# Patient Record
Sex: Male | Born: 1955 | Race: White | Hispanic: No | Marital: Single | Smoking: Former smoker
Health system: Southern US, Community
[De-identification: ages and names within clinical notes are randomized; demographics above are authoritative.]

## PROBLEM LIST (undated history)

## (undated) DIAGNOSIS — M1990 Unspecified osteoarthritis, unspecified site: Secondary | ICD-10-CM

## (undated) DIAGNOSIS — I10 Essential (primary) hypertension: Secondary | ICD-10-CM

## (undated) DIAGNOSIS — Z8719 Personal history of other diseases of the digestive system: Secondary | ICD-10-CM

## (undated) HISTORY — PX: EPIDIDYMIS SURGERY: SHX843

## (undated) HISTORY — DX: Unspecified osteoarthritis, unspecified site: M19.90

## (undated) HISTORY — PX: INGUINAL HERNIA REPAIR: SUR1180

## (undated) HISTORY — PX: NERVE GRAFT: SHX721

## (undated) HISTORY — PX: POLYPECTOMY: SHX149

## (undated) HISTORY — PX: DENTAL SURGERY: SHX609

## (undated) HISTORY — PX: EYE SURGERY: SHX253

## (undated) HISTORY — DX: Essential (primary) hypertension: I10

## (undated) HISTORY — DX: Personal history of other diseases of the digestive system: Z87.19

## (undated) HISTORY — PX: OTHER SURGICAL HISTORY: SHX169

## (undated) HISTORY — PX: COLONOSCOPY: SHX174

## (undated) HISTORY — PX: FRACTURE SURGERY: SHX138

---

## 1996-07-01 HISTORY — PX: UPPER GASTROINTESTINAL ENDOSCOPY: SHX188

## 2015-07-02 HISTORY — PX: COLONOSCOPY: SHX174

## 2016-03-16 DIAGNOSIS — E559 Vitamin D deficiency, unspecified: Secondary | ICD-10-CM | POA: Insufficient documentation

## 2016-03-16 DIAGNOSIS — I10 Essential (primary) hypertension: Secondary | ICD-10-CM | POA: Insufficient documentation

## 2016-03-16 DIAGNOSIS — R7301 Impaired fasting glucose: Secondary | ICD-10-CM | POA: Insufficient documentation

## 2016-03-16 DIAGNOSIS — K219 Gastro-esophageal reflux disease without esophagitis: Secondary | ICD-10-CM | POA: Insufficient documentation

## 2017-11-11 DIAGNOSIS — R202 Paresthesia of skin: Secondary | ICD-10-CM | POA: Insufficient documentation

## 2018-03-19 DIAGNOSIS — Z8719 Personal history of other diseases of the digestive system: Secondary | ICD-10-CM | POA: Insufficient documentation

## 2019-05-05 ENCOUNTER — Other Ambulatory Visit: Payer: Self-pay

## 2019-05-06 ENCOUNTER — Encounter: Payer: Self-pay | Admitting: Family Medicine

## 2019-05-06 ENCOUNTER — Ambulatory Visit (INDEPENDENT_AMBULATORY_CARE_PROVIDER_SITE_OTHER): Payer: Medicare Other | Admitting: Family Medicine

## 2019-05-06 VITALS — BP 142/76 | HR 62 | Ht 70.0 in | Wt 214.5 lb

## 2019-05-06 DIAGNOSIS — Z0000 Encounter for general adult medical examination without abnormal findings: Secondary | ICD-10-CM | POA: Insufficient documentation

## 2019-05-06 DIAGNOSIS — Z114 Encounter for screening for human immunodeficiency virus [HIV]: Secondary | ICD-10-CM

## 2019-05-06 DIAGNOSIS — Z23 Encounter for immunization: Secondary | ICD-10-CM | POA: Diagnosis not present

## 2019-05-06 DIAGNOSIS — E559 Vitamin D deficiency, unspecified: Secondary | ICD-10-CM

## 2019-05-06 DIAGNOSIS — I10 Essential (primary) hypertension: Secondary | ICD-10-CM | POA: Diagnosis not present

## 2019-05-06 DIAGNOSIS — F1010 Alcohol abuse, uncomplicated: Secondary | ICD-10-CM | POA: Diagnosis not present

## 2019-05-06 DIAGNOSIS — Z125 Encounter for screening for malignant neoplasm of prostate: Secondary | ICD-10-CM | POA: Insufficient documentation

## 2019-05-06 DIAGNOSIS — Z131 Encounter for screening for diabetes mellitus: Secondary | ICD-10-CM | POA: Insufficient documentation

## 2019-05-06 DIAGNOSIS — Z1159 Encounter for screening for other viral diseases: Secondary | ICD-10-CM | POA: Insufficient documentation
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
## 2019-05-06 NOTE — Patient Instructions (Signed)
Alcohol Use Disorder Alcohol use disorder is when your drinking disrupts your daily life. When you have this condition, you drink too much alcohol and you cannot control your drinking. Alcohol use disorder can cause serious problems with your physical health. It can affect your brain, heart, liver, pancreas, immune system, stomach, and intestines. Alcohol use disorder can increase your risk for certain cancers and cause problems with your mental health, such as depression, anxiety, psychosis, delirium, and dementia. People with this disorder risk hurting themselves and others. What are the causes? This condition is caused by drinking too much alcohol over time. It is not caused by drinking too much alcohol only one or two times. Some people with this condition drink alcohol to cope with or escape from negative life events. Others drink to relieve pain or symptoms of mental illness. What increases the risk? You are more likely to develop this condition if:  You have a family history of alcohol use disorder.  Your culture encourages drinking to the point of intoxication, or makes alcohol easy to get.  You had a mood or conduct disorder in childhood.  You have been a victim of abuse.  You are an adolescent and: ? You have poor grades or difficulties in school. ? Your caregivers do not talk to you about saying no to alcohol, or supervise your activities. ? You are impulsive or you have trouble with self-control. What are the signs or symptoms? Symptoms of this condition include:  Drinkingmore than you want to.  Drinking for longer than you want to.  Trying several times to drink less or to control your drinking.  Spending a lot of time getting alcohol, drinking, or recovering from drinking.  Craving alcohol.  Having problems at work, at school, or at home due to drinking.  Having problems in relationships due to drinking.  Drinking when it is dangerous to drink, such as before  driving a car.  Continuing to drink even though you know you might have a physical or mental problem related to drinking.  Needing more and more alcohol to get the same effect you want from the alcohol (building up tolerance).  Having symptoms of withdrawal when you stop drinking. Symptoms of withdrawal include: ? Fatigue. ? Nightmares. ? Trouble sleeping. ? Depression. ? Anxiety. ? Fever. ? Seizures. ? Severe confusion. ? Feeling or seeing things that are not there (hallucinations). ? Tremors. ? Rapid heart rate. ? Rapid breathing. ? High blood pressure.  Drinking to avoid symptoms of withdrawal. How is this diagnosed? This condition is diagnosed with an assessment. Your health care provider may start the assessment by asking three or four questions about your drinking. Your health care provider may perform a physical exam or do lab tests to see if you have physical problems resulting from alcohol use. She or he may refer you to a mental health professional for evaluation. How is this treated? Some people with alcohol use disorder are able to reduce their alcohol use to low-risk levels. Others need to completely quit drinking alcohol. When necessary, mental health professionals with specialized training in substance use treatment can help. Your health care provider can help you decide how severe your alcohol use disorder is and what type of treatment you need. The following forms of treatment are available:  Detoxification. Detoxification involves quitting drinking and using prescription medicines within the first week to help lessen withdrawal symptoms. This treatment is important for people who have had withdrawal symptoms before and for heavy drinkers  who are likely to have withdrawal symptoms. Alcohol withdrawal can be dangerous, and in severe cases, it can cause death. Detoxification may be provided in a home, community, or primary care setting, or in a hospital or substance use  treatment facility.  Counseling. This treatment is also called talk therapy. It is provided by substance use treatment counselors. A counselor can address the reasons you use alcohol and suggest ways to keep you from drinking again or to prevent problem drinking. The goals of talk therapy are to: ? Find healthy activities and ways for you to cope with stress. ? Identify and avoid the things that trigger your alcohol use. ? Help you learn how to handle cravings.  Medicines.Medicines can help treat alcohol use disorder by: ? Decreasing alcohol cravings. ? Decreasing the positive feeling you have when you drink alcohol. ? Causing an uncomfortable physical reaction when you drink alcohol (aversion therapy).  Support groups. Support groups are led by people who have quit drinking. They provide emotional support, advice, and guidance. These forms of treatment are often combined. Some people with this condition benefit from a combination of treatments provided by specialized substance use treatment centers. Follow these instructions at home:  Take over-the-counter and prescription medicines only as told by your health care provider.  Check with your health care provider before starting any new medicines.  Ask friends and family members not to offer you alcohol.  Avoid situations where alcohol is served, including gatherings where others are drinking alcohol.  Create a plan for what to do when you are tempted to use alcohol.  Find hobbies or activities that you enjoy that do not include alcohol.  Keep all follow-up visits as told by your health care provider. This is important. How is this prevented?  If you drink, limit alcohol intake to no more than 1 drink a day for nonpregnant women and 2 drinks a day for men. One drink equals 12 oz of beer, 5 oz of wine, or 1 oz of hard liquor.  If you have a mental health condition, get treatment and support.  Do not give alcohol to adolescents.   If you are an adolescent: ? Do not drink alcohol. ? Do not be afraid to say no if someone offers you alcohol. Speak up about why you do not want to drink. You can be a positive role model for your friends and set a good example for those around you by not drinking alcohol. ? If your friends drink, spend time with others who do not drink alcohol. Make new friends who do not use alcohol. ? Find healthy ways to manage stress and emotions, such as meditation or deep breathing, exercise, spending time in nature, listening to music, or talking with a trusted friend or family member. Contact a health care provider if:  You are not able to take your medicines as told.  Your symptoms get worse.  You return to drinking alcohol (relapse) and your symptoms get worse. Get help right away if:  You have thoughts about hurting yourself or others. If you ever feel like you may hurt yourself or others, or have thoughts about taking your own life, get help right away. You can go to your nearest emergency department or call:  Your local emergency services (911 in the [place]  A suicide crisis helpline, such as the [facility] at (903)242-9409. This is open 24 hours a day. Summary  Alcohol use disorder is when your drinking disrupts your daily  life. When you have this condition, you drink too much alcohol and you cannot control your drinking.  Treatment may include detoxification, counseling, medicine, and support groups.  Ask friends and family members not to offer you alcohol. Avoid situations where alcohol is served.  Get help right away if you have thoughts about hurting yourself or others. This information is not intended to replace advice given to you by your health care provider. Make sure you discuss any questions you have with your health care provider. Document Released: 07/25/2004 Document Revised: 05/30/2017 Document Reviewed: 03/14/2016 Elsevier Patient Education   [place] Maintenance After Age 8 After age 106, you are at a higher risk for certain long-term diseases and infections as well as injuries from falls. Falls are a major cause of broken bones and head injuries in people who are older than age 59. Getting regular preventive care can help to keep you healthy and well. Preventive care includes getting regular testing and making lifestyle changes as recommended by your health care provider. Talk with your health care provider about:  Which screenings and tests you should have. A screening is a test that checks for a disease when you have no symptoms.  A diet and exercise plan that is right for you. What should I know about screenings and tests to prevent falls? Screening and testing are the best ways to find a health problem early. Early diagnosis and treatment give you the best chance of managing medical conditions that are common after age 22. Certain conditions and lifestyle choices may make you more likely to have a fall. Your health care provider may recommend:  Regular vision checks. Poor vision and conditions such as cataracts can make you more likely to have a fall. If you wear glasses, make sure to get your prescription updated if your vision changes.  Medicine review. Work with your health care provider to regularly review all of the medicines you are taking, including over-the-counter medicines. Ask your health care provider about any side effects that may make you more likely to have a fall. Tell your health care provider if any medicines that you take make you feel dizzy or sleepy.  Osteoporosis screening. Osteoporosis is a condition that causes the bones to get weaker. This can make the bones weak and cause them to break more easily.  Blood pressure screening. Blood pressure changes and medicines to control blood pressure can make you feel dizzy.  Strength and balance checks. Your health care provider may recommend  certain tests to check your strength and balance while standing, walking, or changing positions.  Foot health exam. Foot pain and numbness, as well as not wearing proper footwear, can make you more likely to have a fall.  Depression screening. You may be more likely to have a fall if you have a fear of falling, feel emotionally low, or feel unable to do activities that you used to do.  Alcohol use screening. Using too much alcohol can affect your balance and may make you more likely to have a fall. What actions can I take to lower my risk of falls? General instructions  Talk with your health care provider about your risks for falling. Tell your health care provider if: ? You fall. Be sure to tell your health care provider about all falls, even ones that seem minor. ? You feel dizzy, sleepy, or off-balance.  Take over-the-counter and prescription medicines only as told by your health care provider. These include any  supplements.  Eat a healthy diet and maintain a healthy weight. A healthy diet includes low-fat dairy products, low-fat (lean) meats, and fiber from whole grains, beans, and lots of fruits and vegetables. Home safety  Remove any tripping hazards, such as rugs, cords, and clutter.  Install safety equipment such as grab bars in bathrooms and safety rails on stairs.  Keep rooms and walkways well-lit. Activity   Follow a regular exercise program to stay fit. This will help you maintain your balance. Ask your health care provider what types of exercise are appropriate for you.  If you need a cane or walker, use it as recommended by your health care provider.  Wear supportive shoes that have nonskid soles. Lifestyle  Do not drink alcohol if your health care provider tells you not to drink.  If you drink alcohol, limit how much you have: ? 0-1 drink a day for women. ? 0-2 drinks a day for men.  Be aware of how much alcohol is in your drink. In the [place] one drink equals  one typical bottle of beer (12 oz), one-half glass of wine (5 oz), or one shot of hard liquor (1 oz).  Do not use any products that contain nicotine or tobacco, such as cigarettes and e-cigarettes. If you need help quitting, ask your health care provider. Summary  Having a healthy lifestyle and getting preventive care can help to protect your health and wellness after age 57.  Screening and testing are the best way to find a health problem early and help you avoid having a fall. Early diagnosis and treatment give you the best chance for managing medical conditions that are more common for people who are older than age 12.  Falls are a major cause of broken bones and head injuries in people who are older than age 45. Take precautions to prevent a fall at home.  Work with your health care provider to learn what changes you can make to improve your health and wellness and to prevent falls. This information is not intended to replace advice given to you by your health care provider. Make sure you discuss any questions you have with your health care provider. Document Released: 04/30/2017 Document Revised: 10/08/2018 Document Reviewed: 04/30/2017 Elsevier Patient Education  2020 [place] 65 Years and Older, Male Preventive care refers to lifestyle choices and visits with your health care provider that can promote health and wellness. This includes:  A yearly physical exam. This is also called an annual well check.  Regular dental and eye exams.  Immunizations.  Screening for certain conditions.  Healthy lifestyle choices, such as diet and exercise. What can I expect for my preventive care visit? Physical exam Your health care provider will check:  Height and weight. These may be used to calculate body mass index (BMI), which is a measurement that tells if you are at a healthy weight.  Heart rate and blood pressure.  Your skin for abnormal spots. Counseling  Your health care provider may ask you questions about:  Alcohol, tobacco, and drug use.  Emotional well-being.  Home and relationship well-being.  Sexual activity.  Eating habits.  History of falls.  Memory and ability to understand (cognition).  Work and work Statistician. What immunizations do I need?  Influenza (flu) vaccine  This is recommended every year. Tetanus, diphtheria, and pertussis (Tdap) vaccine  You may need a Td booster every 10 years. Varicella (chickenpox) vaccine  You may need this vaccine if  you have not already been vaccinated. Zoster (shingles) vaccine  You may need this after age 61. Pneumococcal conjugate (PCV13) vaccine  One dose is recommended after age 4. Pneumococcal polysaccharide (PPSV23) vaccine  One dose is recommended after age 69. Measles, mumps, and rubella (MMR) vaccine  You may need at least one dose of MMR if you were born in 1957 or later. You may also need a second dose. Meningococcal conjugate (MenACWY) vaccine  You may need this if you have certain conditions. Hepatitis A vaccine  You may need this if you have certain conditions or if you travel or work in places where you may be exposed to hepatitis A. Hepatitis B vaccine  You may need this if you have certain conditions or if you travel or work in places where you may be exposed to hepatitis B. Haemophilus influenzae type b (Hib) vaccine  You may need this if you have certain conditions. You may receive vaccines as individual doses or as more than one vaccine together in one shot (combination vaccines). Talk with your health care provider about the risks and benefits of combination vaccines. What tests do I need? Blood tests  Lipid and cholesterol levels. These may be checked every 5 years, or more frequently depending on your overall health.  Hepatitis C test.  Hepatitis B test. Screening  Lung cancer screening. You may have this screening every year starting  at age 15 if you have a 30-pack-year history of smoking and currently smoke or have quit within the past 15 years.  Colorectal cancer screening. All adults should have this screening starting at age 19 and continuing until age 58. Your health care provider may recommend screening at age 60 if you are at increased risk. You will have tests every 1-10 years, depending on your results and the type of screening test.  Prostate cancer screening. Recommendations will vary depending on your family history and other risks.  Diabetes screening. This is done by checking your blood sugar (glucose) after you have not eaten for a while (fasting). You may have this done every 1-3 years.  Abdominal aortic aneurysm (AAA) screening. You may need this if you are a current or former smoker.  Sexually transmitted disease (STD) testing. Follow these instructions at home: Eating and drinking  Eat a diet that includes fresh fruits and vegetables, whole grains, lean protein, and low-fat dairy products. Limit your intake of foods with high amounts of sugar, saturated fats, and salt.  Take vitamin and mineral supplements as recommended by your health care provider.  Do not drink alcohol if your health care provider tells you not to drink.  If you drink alcohol: ? Limit how much you have to 0-2 drinks a day. ? Be aware of how much alcohol is in your drink. In the [place] one drink equals one 12 oz bottle of beer (355 mL), one 5 oz glass of wine (148 mL), or one 1 oz glass of hard liquor (44 mL). Lifestyle  Take daily care of your teeth and gums.  Stay active. Exercise for at least 30 minutes on 5 or more days each week.  Do not use any products that contain nicotine or tobacco, such as cigarettes, e-cigarettes, and chewing tobacco. If you need help quitting, ask your health care provider.  If you are sexually active, practice safe sex. Use a condom or other form of protection to prevent STIs (sexually transmitted  infections).  Talk with your health care provider about taking a low-dose  aspirin or statin. What's next?  Visit your health care provider once a year for a well check visit.  Ask your health care provider how often you should have your eyes and teeth checked.  Stay up to date on all vaccines. This information is not intended to replace advice given to you by your health care provider. Make sure you discuss any questions you have with your health care provider. Document Released: 07/14/2015 Document Revised: 06/11/2018 Document Reviewed: 06/11/2018 Elsevier Patient Education  2020 [employer].

## 2019-05-10 ENCOUNTER — Other Ambulatory Visit: Payer: Self-pay

## 2019-05-11 ENCOUNTER — Other Ambulatory Visit (INDEPENDENT_AMBULATORY_CARE_PROVIDER_SITE_OTHER): Payer: Medicare Other

## 2019-05-11 DIAGNOSIS — Z0000 Encounter for general adult medical examination without abnormal findings: Secondary | ICD-10-CM

## 2019-05-11 DIAGNOSIS — F1010 Alcohol abuse, uncomplicated: Secondary | ICD-10-CM

## 2019-05-11 DIAGNOSIS — I10 Essential (primary) hypertension: Secondary | ICD-10-CM | POA: Diagnosis not present

## 2019-05-11 DIAGNOSIS — Z114 Encounter for screening for human immunodeficiency virus [HIV]: Secondary | ICD-10-CM

## 2019-05-11 DIAGNOSIS — E559 Vitamin D deficiency, unspecified: Secondary | ICD-10-CM

## 2019-05-11 DIAGNOSIS — Z1159 Encounter for screening for other viral diseases: Secondary | ICD-10-CM

## 2019-05-11 LAB — CBC
HCT: 44.3 % (ref 39.0–52.0)
Hemoglobin: 14.7 g/dL (ref 13.0–17.0)
MCHC: 33.3 g/dL (ref 30.0–36.0)
MCV: 99.8 fl (ref 78.0–100.0)
Platelets: 198 10*3/uL (ref 150.0–400.0)
RBC: 4.44 Mil/uL (ref 4.22–5.81)
RDW: 13.5 % (ref 11.5–15.5)
WBC: 6.1 10*3/uL (ref 4.0–10.5)

## 2019-05-11 LAB — URINALYSIS, ROUTINE W REFLEX MICROSCOPIC
Bilirubin Urine: NEGATIVE
Ketones, ur: NEGATIVE
Leukocytes,Ua: NEGATIVE
Nitrite: NEGATIVE
RBC / HPF: NONE SEEN (ref 0–?)
Specific Gravity, Urine: 1.01 (ref 1.000–1.030)
Total Protein, Urine: NEGATIVE
Urine Glucose: NEGATIVE
Urobilinogen, UA: 0.2 (ref 0.0–1.0)
WBC, UA: NONE SEEN (ref 0–?)
pH: 6 (ref 5.0–8.0)

## 2019-05-11 LAB — COMPREHENSIVE METABOLIC PANEL
ALT: 21 U/L (ref 0–53)
AST: 18 U/L (ref 0–37)
Albumin: 4.4 g/dL (ref 3.5–5.2)
Alkaline Phosphatase: 92 U/L (ref 39–117)
BUN: 12 mg/dL (ref 6–23)
CO2: 27 mEq/L (ref 19–32)
Calcium: 9.3 mg/dL (ref 8.4–10.5)
Chloride: 105 mEq/L (ref 96–112)
Creatinine, Ser: 0.75 mg/dL (ref 0.40–1.50)
GFR: 104.41 mL/min (ref >60.00)
Glucose, Bld: 109 mg/dL — ABNORMAL HIGH (ref 70–99)
Potassium: 4 mEq/L (ref 3.5–5.1)
Sodium: 139 mEq/L (ref 135–145)
Total Bilirubin: 0.4 mg/dL (ref 0.2–1.2)
Total Protein: 6.4 g/dL (ref 6.0–8.3)

## 2019-05-11 LAB — VITAMIN D 25 HYDROXY (VIT D DEFICIENCY, FRACTURES): VITD: 31.13 ng/mL (ref 30.00–100.00)

## 2019-05-11 LAB — LIPID PANEL
Cholesterol: 190 mg/dL (ref 0–200)
HDL: 99.3 mg/dL (ref >39.00)
LDL Cholesterol: 82 mg/dL (ref 0–99)
NonHDL: 90.49
Total CHOL/HDL Ratio: 2
Triglycerides: 41 mg/dL (ref 0.0–149.0)
VLDL: 8.2 mg/dL (ref 0.0–40.0)

## 2019-05-11 LAB — GAMMA GT: GGT: 35 U/L (ref 7–51)

## 2019-05-11 LAB — PSA: PSA: 1.47 ng/mL (ref 0.10–4.00)

## 2019-05-12 LAB — HIV ANTIBODY (ROUTINE TESTING W REFLEX): HIV 1&2 Ab, 4th Generation: NONREACTIVE

## 2019-05-12 LAB — HEPATITIS C ANTIBODY
Hepatitis C Ab: NONREACTIVE
SIGNAL TO CUT-OFF: 0.01 (ref <1.00)

## 2019-05-18 ENCOUNTER — Telehealth: Payer: Self-pay | Admitting: Family Medicine

## 2019-05-18 NOTE — Telephone Encounter (Signed)
losartan (COZAAR) 100 MG tablet    Patient requesting a refill of this medication. However he is requesting it be 50mg  rather than the 100mg .     Pharmacy:  [id number] - [place], [place] [id number] (Phone) [phone] (Fax)

## 2019-05-18 NOTE — Telephone Encounter (Signed)
Pt takes 1/2 tablet, okay to change to losartan 50 mg 1 tab qd?

## 2019-05-19 MED ORDER — LOSARTAN POTASSIUM 50 MG PO TABS: 50.0000 mg | ORAL_TABLET | Freq: Every day | ORAL | 1 refills | Status: DC

## 2019-05-19 NOTE — Telephone Encounter (Signed)
Yes. Please have him check and record his Bps on the 50mg  dose.

## 2019-06-17 ENCOUNTER — Telehealth: Payer: Self-pay | Admitting: Family Medicine

## 2019-06-17 NOTE — Telephone Encounter (Signed)
ROI fax to [facility] for patient records.

## 2019-06-18 NOTE — Telephone Encounter (Signed)
rec'd from [facility] forwarded 6 pages to [place] Provider

## 2019-07-30 ENCOUNTER — Ambulatory Visit: Payer: PRIVATE HEALTH INSURANCE

## 2019-08-06 ENCOUNTER — Encounter: Payer: Self-pay | Admitting: Family Medicine

## 2019-08-07 ENCOUNTER — Ambulatory Visit: Payer: Medicare Other | Attending: Internal Medicine

## 2019-08-07 DIAGNOSIS — Z23 Encounter for immunization: Secondary | ICD-10-CM | POA: Insufficient documentation

## 2019-08-07 NOTE — Progress Notes (Signed)
   Covid-19 Vaccination Clinic  Name:  VENICE RUYLE    MRN: [id number] DOB: 1956-02-01  08/07/2019  Mr. Strum was observed post Covid-19 immunization for 15 minutes without incidence. He was provided with Vaccine Information Sheet and instruction to access the V-Safe system.   Mr. Hutzell was instructed to call 911 with any severe reactions post vaccine: Marland Kitchen Difficulty breathing  . Swelling of your face and throat  . A fast heartbeat  . A bad rash all over your body  . Dizziness and weakness    Immunizations Administered    Name Date Dose VIS Date Route   Pfizer COVID-19 Vaccine 08/07/2019  3:21 PM 0.3 mL 06/11/2019 Intramuscular   Manufacturer: [place]   Lot: [id number]   [place]: [id number]

## 2019-08-09 ENCOUNTER — Other Ambulatory Visit: Payer: Self-pay

## 2019-08-10 ENCOUNTER — Encounter: Payer: Self-pay | Admitting: Family Medicine

## 2019-08-10 ENCOUNTER — Ambulatory Visit (INDEPENDENT_AMBULATORY_CARE_PROVIDER_SITE_OTHER): Payer: Medicare Other | Admitting: Family Medicine

## 2019-08-10 VITALS — BP 146/94 | HR 67 | Temp 96.5°F | Ht 70.0 in | Wt 216.2 lb

## 2019-08-10 DIAGNOSIS — Z789 Other specified health status: Secondary | ICD-10-CM | POA: Insufficient documentation

## 2019-08-10 DIAGNOSIS — Z7289 Other problems related to lifestyle: Secondary | ICD-10-CM | POA: Insufficient documentation

## 2019-08-10 DIAGNOSIS — Z87448 Personal history of other diseases of urinary system: Secondary | ICD-10-CM | POA: Insufficient documentation

## 2019-08-10 DIAGNOSIS — I10 Essential (primary) hypertension: Secondary | ICD-10-CM | POA: Diagnosis not present

## 2019-08-10 DIAGNOSIS — E559 Vitamin D deficiency, unspecified: Secondary | ICD-10-CM | POA: Diagnosis not present

## 2019-08-10 DIAGNOSIS — Z87898 Personal history of other specified conditions: Secondary | ICD-10-CM | POA: Insufficient documentation

## 2019-08-10 MED ORDER — LOSARTAN POTASSIUM 100 MG PO TABS: 100.0000 mg | ORAL_TABLET | Freq: Every day | ORAL | 0 refills | Status: DC
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
## 2019-08-20 ENCOUNTER — Ambulatory Visit: Payer: PRIVATE HEALTH INSURANCE

## 2019-09-01 ENCOUNTER — Ambulatory Visit: Payer: Medicare Other | Attending: Internal Medicine

## 2019-09-01 DIAGNOSIS — Z23 Encounter for immunization: Secondary | ICD-10-CM | POA: Insufficient documentation

## 2019-09-01 NOTE — Progress Notes (Signed)
   Covid-19 Vaccination Clinic  Name:  Kyle Crawford    MRN: [id number] DOB: 1956/04/24  09/01/2019  Mr. Kyle Crawford was observed post Covid-19 immunization for 15 minutes without incident. He was provided with Vaccine Information Sheet and instruction to access the V-Safe system.   Mr. Kyle Crawford was instructed to call 911 with any severe reactions post vaccine: Marland Kitchen Difficulty breathing  . Swelling of face and throat  . A fast heartbeat  . A bad rash all over body  . Dizziness and weakness   Immunizations Administered    Name Date Dose VIS Date Route   Pfizer COVID-19 Vaccine 09/01/2019 11:17 AM 0.3 mL 06/11/2019 Intramuscular   Manufacturer: [place]   Lot: [id number]   [place]: [id number]

## 2019-10-12 ENCOUNTER — Other Ambulatory Visit: Payer: Self-pay

## 2019-10-13 ENCOUNTER — Ambulatory Visit (INDEPENDENT_AMBULATORY_CARE_PROVIDER_SITE_OTHER): Payer: Medicare Other

## 2019-10-13 ENCOUNTER — Ambulatory Visit (INDEPENDENT_AMBULATORY_CARE_PROVIDER_SITE_OTHER): Payer: Medicare Other | Admitting: Nurse Practitioner

## 2019-10-13 ENCOUNTER — Encounter: Payer: Self-pay | Admitting: Nurse Practitioner

## 2019-10-13 VITALS — BP 140/84 | HR 63 | Temp 97.2°F | Ht 70.0 in | Wt 214.6 lb

## 2019-10-13 DIAGNOSIS — S91331A Puncture wound without foreign body, right foot, initial encounter: Secondary | ICD-10-CM

## 2019-10-13 MED ORDER — CIPROFLOXACIN HCL 500 MG PO TABS: 500.0000 mg | ORAL_TABLET | Freq: Two times a day (BID) | ORAL | 0 refills | Status: DC

## 2019-10-13 MED ORDER — SULFAMETHOXAZOLE-TRIMETHOPRIM 800-160 MG PO TABS: 1.0000 | ORAL_TABLET | Freq: Two times a day (BID) | ORAL | 0 refills | Status: DC

## 2019-10-13 NOTE — Progress Notes (Signed)
Subjective:  Patient ID: Kyle Crawford, male    DOB: [birth year]  Age: 64 y.o. MRN: [id number]  CC: Foot Injury (Right foot has glass in the middle of foot 2 weeks ago//its go more sore but can't see it//heel went numb)  Foot Injury  The incident occurred more than 1 week ago. The incident occurred at home. Injury mechanism: stepped on pice of glass. The pain is present in the right heel. The quality of the pain is described as aching (numbness, redness). The pain has been constant since onset. Associated symptoms include a loss of sensation and numbness. Pertinent negatives include no inability to bear weight, loss of motion, muscle weakness or tingling. Possible foreign bodies include glass. The symptoms are aggravated by palpation and weight bearing. Treatments tried: soaking in warm water and epsom salt.  UTD with Tdap vaccine.  Reviewed past Medical, Social and Family history today.  Outpatient Medications Prior to Visit  Medication Sig Dispense Refill  . losartan (COZAAR) 100 MG tablet Take 1 tablet (100 mg total) by mouth daily. 90 tablet 0   No facility-administered medications prior to visit.   ROS See HPI  Objective:  BP 140/84   Pulse 63   Temp (!) 97.2 F (36.2 C) (Tympanic)   Ht 5\' 10"  (1.778 m)   Wt 214 lb 9.6 oz (97.3 kg)   SpO2 95%   BMI 30.79 kg/m   BP Readings from Last 3 Encounters:  10/13/19 140/84  08/10/19 (!) 146/94  05/06/19 (!) 142/76    Wt Readings from Last 3 Encounters:  10/13/19 214 lb 9.6 oz (97.3 kg)  08/10/19 216 lb 3.2 oz (98.1 kg)  05/06/19 214 lb 8 oz (97.3 kg)    Physical Exam Vitals reviewed.  Cardiovascular:     Rate and Rhythm: Normal rate.     Pulses: Normal pulses.          Dorsalis pedis pulses are 2+ on the right side.       Posterior tibial pulses are 2+ on the right side.  Pulmonary:     Effort: Pulmonary effort is normal.  Musculoskeletal:        General: Swelling, tenderness and signs of injury present.   Feet:  Feet:     Right foot:     Skin integrity: Erythema present.     Comments: Verbal consent obtained. Removal of foreign object from Right heel: With use of #11 blade and tweezers, I removed a small piece of glass, explored wound for additional piece and known found, minimal blood glass and no complication from the procedure. Cleaned with saline and covered dry guaze. Provide wound care instruction, sent for foot x-ray and oral abx prescribed (coverage for staph and pseudomonas) Skin:    Findings: Erythema present.  Neurological:     Mental Status: He is alert.    Lab Results  Component Value Date   WBC 6.1 05/11/2019   HGB 14.7 05/11/2019   HCT 44.3 05/11/2019   PLT 198.0 05/11/2019   GLUCOSE 109 (H) 05/11/2019   CHOL 190 05/11/2019   TRIG 41.0 05/11/2019   HDL 99.30 05/11/2019   LDLCALC 82 05/11/2019   ALT 21 05/11/2019   AST 18 05/11/2019   NA 139 05/11/2019   K 4.0 05/11/2019   CL 105 05/11/2019   CREATININE 0.75 05/11/2019   BUN 12 05/11/2019   CO2 27 05/11/2019   PSA 1.47 05/11/2019   Assessment & Plan:  This visit occurred during the SARS-CoV-2 public  health emergency.  Safety protocols were in place, including screening questions prior to the visit, additional usage of staff PPE, and extensive cleaning of exam room while observing appropriate contact time as indicated for disinfecting solutions.   Sushil was seen today for foot injury.  Diagnoses and all orders for this visit:  Puncture wound of right foot excluding toes with complication, initial encounter -     Discontinue: sulfamethoxazole-trimethoprim (BACTRIM DS) 800-160 MG tablet; Take 1 tablet by mouth 2 (two) times daily. -     DG Foot Complete Right -     ciprofloxacin (CIPRO) 500 MG tablet; Take 1 tablet (500 mg total) by mouth 2 (two) times daily. For plantar puncture wound  DG foot: negative for foreign object. I have discontinued Xayne Berardino. Sar "Greg"'s sulfamethoxazole-trimethoprim. I am  also having him start on ciprofloxacin. Additionally, I am having him maintain his losartan.  Meds ordered this encounter  Medications  . DISCONTD: sulfamethoxazole-trimethoprim (BACTRIM DS) 800-160 MG tablet    Sig: Take 1 tablet by mouth 2 (two) times daily.    Dispense:  14 tablet    Refill:  0    Order Specific Question:   Supervising Provider    Answer:   Ronnald Nian [id number]  . ciprofloxacin (CIPRO) 500 MG tablet    Sig: Take 1 tablet (500 mg total) by mouth 2 (two) times daily. For plantar puncture wound    Dispense:  14 tablet    Refill:  0    Discontinue bactrim.    Order Specific Question:   Supervising Provider    Answer:   Ronnald Nian [id number]    Problem List Items Addressed This Visit    None    Visit Diagnoses    Puncture wound of right foot excluding toes with complication, initial encounter    -  Primary   Relevant Medications   ciprofloxacin (CIPRO) 500 MG tablet   Other Relevant Orders   DG Foot Complete Right (Completed)      Follow-up: Return if symptoms worsen or fail to improve.  Wilfred Lacy, NP

## 2019-10-13 NOTE — Patient Instructions (Addendum)
DG foot: negative for foreign object.  Puncture Wound A puncture wound is an injury that is caused by a sharp, thin object that goes through your skin. A puncture wound usually does not leave a large opening in your skin, so it may not bleed a lot. However, when you get a puncture wound, dirt or other materials (foreign bodies) can be forced into your wound and can break off inside. This increases the chance of infection, such as tetanus. There are many sharp, pointed objects that can cause puncture wounds, including teeth, nails, splinters of glass, fishhooks, and needles. Treatment may include the following steps:  Washing out the wound with a germ-free (sterile) salt-water solution.  Having surgery to open the wound and remove materials from it.  Closing the wound with stitches (sutures).  Covering the wound with antibiotic ointment and a bandage (dressing). Depending on what caused the injury, you may also need a tetanus shot or a rabies shot. Follow these instructions at home: Medicines  Take or apply over-the-counter and prescription medicines only as told by your doctor.  If you were prescribed an antibiotic medicine, take or apply it as told by your doctor. Do not stop using the antibiotic even if your condition starts to get better. Bathing  Keep the bandage dry as told by your doctor.  Do not take baths, swim, or use a hot tub until your doctor approves. Ask your doctor if you may take showers. You may only be allowed to take sponge baths. Wound care   There are many ways to close and cover a wound. For example, a wound can be closed with stitches, skin glue, or skin tape (adhesive strips). Follow instructions from your doctor about how to take care of your wound. Make sure you: ? Wash your hands with soap and water before and after you change your bandage. If you cannot use soap and water, use hand sanitizer. ? Change your bandage as told by your doctor. ? Leave stitches,  skin glue, or skin tape strips in place. They may need to stay in place for 2 weeks or longer. If tape strips get loose and curl up, you may trim the loose edges. Do not remove tape strips completely unless your doctor says it is okay.  Clean the wound as told by your doctor.  Do not scratch or pick at the wound.  Check your wound every day for signs of infection. Watch for: ? Redness, swelling, or pain. ? Fluid or blood. ? Warmth. ? Pus or a bad smell. General instructions  Raise (elevate) the injured area above the level of your heart while you are sitting or lying down.  If your puncture wound is in your foot, ask your doctor if you need to avoid putting weight on your foot and for how long. Use crutches as told by your doctor.  Keep all follow-up visits as told by your doctor. This is important. Contact a doctor if:  You got a tetanus shot and you have any of these problems at the injection site: ? Swelling. ? Very bad pain. ? Redness. ? Bleeding.  You have a fever.  Your stitches come out.  You notice a bad smell coming from your wound or your bandage.  You notice something coming out of the wound, such as wood or glass.  Medicine does not help your pain.  You have more redness, swelling, or pain at the site of your wound.  You have fluid, blood, or pus  coming from your wound.  You notice a change in the color of your skin near your wound.  You need to change the bandage often because fluid, blood, or pus is coming from the wound.  You start to have a new rash.  You start to lose feeling (have numbness) around the wound.  You have warmth around your wound. Get help right away if:  You have very bad swelling around the wound.  Your pain quickly gets worse and is very bad.  You start to get painful skin lumps.  You have a red streak going away from your wound.  The wound is on your hand or foot and you: ? Cannot move a finger or toe like  normal. ? Notice that your fingers or toes look pale or blue. Summary  A puncture wound is an injury that is caused by a sharp, thin object that goes through your skin.  Treatment may include washing out the wound, having surgery to open the wound to clean it, closing the wound, and covering the wound with a bandage.  Follow instructions from your doctor about how to take care of your wound.  Contact your doctor if you have more redness, swelling, or pain at the site of your wound.  Keep all follow-up visits as told by your doctor. This is important. This information is not intended to replace advice given to you by your health care provider. Make sure you discuss any questions you have with your health care provider. Document Revised: 01/22/2018 Document Reviewed: 01/22/2018 Elsevier Patient Education  [place].

## 2019-10-14 ENCOUNTER — Encounter: Payer: Self-pay | Admitting: Nurse Practitioner

## 2019-11-01 ENCOUNTER — Other Ambulatory Visit: Payer: Self-pay | Admitting: Family Medicine

## 2019-11-03 ENCOUNTER — Other Ambulatory Visit: Payer: Self-pay | Admitting: Family Medicine

## 2019-11-03 DIAGNOSIS — I10 Essential (primary) hypertension: Secondary | ICD-10-CM

## 2019-11-08 ENCOUNTER — Other Ambulatory Visit: Payer: Self-pay

## 2019-11-09 ENCOUNTER — Ambulatory Visit: Payer: PRIVATE HEALTH INSURANCE | Admitting: Family Medicine

## 2020-01-30 ENCOUNTER — Other Ambulatory Visit: Payer: Self-pay | Admitting: Family Medicine

## 2020-01-30 DIAGNOSIS — I10 Essential (primary) hypertension: Secondary | ICD-10-CM

## 2020-01-31 ENCOUNTER — Other Ambulatory Visit: Payer: Self-pay

## 2020-02-01 ENCOUNTER — Encounter: Payer: Self-pay | Admitting: Family Medicine

## 2020-02-01 ENCOUNTER — Ambulatory Visit (INDEPENDENT_AMBULATORY_CARE_PROVIDER_SITE_OTHER): Payer: Medicare Other | Admitting: Family Medicine

## 2020-02-01 VITALS — BP 140/78 | HR 58 | Temp 97.6°F | Ht 70.0 in | Wt 211.2 lb

## 2020-02-01 DIAGNOSIS — L570 Actinic keratosis: Secondary | ICD-10-CM | POA: Diagnosis not present

## 2020-02-01 DIAGNOSIS — I10 Essential (primary) hypertension: Secondary | ICD-10-CM

## 2020-02-01 DIAGNOSIS — N5201 Erectile dysfunction due to arterial insufficiency: Secondary | ICD-10-CM | POA: Diagnosis not present

## 2020-02-01 LAB — BASIC METABOLIC PANEL
BUN: 13 mg/dL (ref 6–23)
CO2: 27 mEq/L (ref 19–32)
Calcium: 9.8 mg/dL (ref 8.4–10.5)
Chloride: 103 mEq/L (ref 96–112)
Creatinine, Ser: 0.85 mg/dL (ref 0.40–1.50)
GFR: 90.16 mL/min (ref >60.00)
Glucose, Bld: 96 mg/dL (ref 70–99)
Potassium: 4.3 mEq/L (ref 3.5–5.1)
Sodium: 136 mEq/L (ref 135–145)

## 2020-02-01 LAB — CBC
HCT: 44.7 % (ref 39.0–52.0)
Hemoglobin: 15 g/dL (ref 13.0–17.0)
MCHC: 33.7 g/dL (ref 30.0–36.0)
MCV: 99.6 fl (ref 78.0–100.0)
Platelets: 189 10*3/uL (ref 150.0–400.0)
RBC: 4.49 Mil/uL (ref 4.22–5.81)
RDW: 12.9 % (ref 11.5–15.5)
WBC: 6.2 10*3/uL (ref 4.0–10.5)

## 2020-02-01 MED ORDER — LOSARTAN POTASSIUM 100 MG PO TABS: 100.0000 mg | ORAL_TABLET | Freq: Every day | ORAL | 0 refills | Status: DC

## 2020-02-01 MED ORDER — SILDENAFIL CITRATE 20 MG PO TABS: ORAL_TABLET | ORAL | 1 refills | Status: DC

## 2020-02-01 MED ORDER — AMLODIPINE BESYLATE 5 MG PO TABS: 5.0000 mg | ORAL_TABLET | Freq: Every day | ORAL | 0 refills | Status: DC

## 2020-02-01 NOTE — Progress Notes (Signed)
Established Patient Office Visit  Subjective:  Patient ID: Kyle Crawford, male    DOB: [birth year]  Age: 64 y.o. MRN: [id number]  CC:  Chief Complaint  Patient presents with  . Follow-up    follow up on medication, would like sore on back checked, Rx for Viagra     HPI Kyle Crawford presents for follow-up of his blood pressure, ED in nonhealing lesions on his skin.  Has been taking losartan daily.  Blood pressure has been up in the 140s over 80s.  No issues taking the medication.  Tells me that he has decreased his alcohol intake significantly.  Has had issues with ED.  He is with a new partner.  There is difficulty achieving an erection.  Libido is okay.  Has had a place on his back that occasionally drains pus.  There is a place on the left side of his nose that is a small nonhealing sore.  Past Medical History:  Diagnosis Date  . Arthritis   . History of Barrett's esophagus   . Hypertension     Past Surgical History:  Procedure Laterality Date  . arm fracture surgery    . COLONOSCOPY    . DENTAL SURGERY    . EPIDIDYMIS SURGERY    . INGUINAL HERNIA REPAIR    . NERVE GRAFT    . tendon repair foot/ankle      Family History  Problem Relation Age of Onset  . Alzheimer's disease Father   . Bladder Cancer Father   . Diabetes Sister   . Diabetes Maternal Grandmother   . Heart attack Maternal Grandfather     Social History   Socioeconomic History  . Marital status: Single    Spouse name: Not on file  . Number of children: Not on file  . Years of education: Not on file  . Highest education level: Not on file  Occupational History  . Not on file  Tobacco Use  . Smoking status: Former Research scientist (life sciences)  . Smokeless tobacco: Never Used  . Tobacco comment: many years ago.  Substance and Sexual Activity  . Alcohol use: Yes    Alcohol/week: 50.0 standard drinks    Types: 50 Cans of beer per week  . Drug use: Yes    Frequency: 7.0 times per week    Types: Marijuana  .  Sexual activity: Not on file  Other Topics Concern  . Not on file  Social History Narrative  . Not on file   Social Determinants of Health   Financial Resource Strain:   . Difficulty of Paying Living Expenses:   Food Insecurity:   . Worried About Charity fundraiser in the Last Year:   . Arboriculturist in the Last Year:   Transportation Needs:   . Film/video editor (Medical):   Marland Kitchen Lack of Transportation (Non-Medical):   Physical Activity:   . Days of Exercise per Week:   . Minutes of Exercise per Session:   Stress:   . Feeling of Stress :   Social Connections:   . Frequency of Communication with Friends and Family:   . Frequency of Social Gatherings with Friends and Family:   . Attends Religious Services:   . Active Member of Clubs or Organizations:   . Attends Archivist Meetings:   Marland Kitchen Marital Status:   Intimate Partner Violence:   . Fear of Current or Ex-Partner:   . Emotionally Abused:   Marland Kitchen Physically Abused:   .  Sexually Abused:     Outpatient Medications Prior to Visit  Medication Sig Dispense Refill  . losartan (COZAAR) 100 MG tablet TAKE 1 TABLET BY MOUTH EVERY DAY 90 tablet 0  . ciprofloxacin (CIPRO) 500 MG tablet Take 1 tablet (500 mg total) by mouth 2 (two) times daily. For plantar puncture wound (Patient not taking: Reported on 02/01/2020) 14 tablet 0   No facility-administered medications prior to visit.    Allergies  Allergen Reactions  . Thimerosal     ROS Review of Systems  Constitutional: Negative.   Respiratory: Negative.   Cardiovascular: Negative.   Gastrointestinal: Negative.   Genitourinary: Negative for difficulty urinating, frequency and urgency.  Musculoskeletal: Negative for gait problem and joint swelling.  Skin: Positive for color change and wound.  Neurological: Negative for light-headedness and headaches.  Hematological: Does not bruise/bleed easily.  Psychiatric/Behavioral: Negative.       Objective:    Physical  Exam Nursing note reviewed.  Constitutional:      General: He is not in acute distress.    Appearance: Normal appearance. He is not ill-appearing or toxic-appearing.  HENT:     Head: Normocephalic and atraumatic.     Right Ear: External ear normal.     Left Ear: External ear normal.     Mouth/Throat:     Mouth: Mucous membranes are dry.     Pharynx: Oropharynx is clear.  Eyes:     General: No scleral icterus.       Right eye: No discharge.        Left eye: No discharge.     Conjunctiva/sclera: Conjunctivae normal.  Cardiovascular:     Rate and Rhythm: Normal rate and regular rhythm.  Pulmonary:     Effort: Pulmonary effort is normal.     Breath sounds: Normal breath sounds.  Abdominal:     General: Bowel sounds are normal.  Musculoskeletal:     Cervical back: No tenderness.     Right lower leg: No edema.     Left lower leg: No edema.  Lymphadenopathy:     Cervical: No cervical adenopathy.  Skin:      Neurological:     Mental Status: He is alert.  Psychiatric:        Mood and Affect: Mood normal.        Behavior: Behavior normal.     BP 140/78   Pulse (!) 58   Temp 97.6 F (36.4 C) (Tympanic)   Ht 5\' 10"  (1.778 m)   Wt 211 lb 3.2 oz (95.8 kg)   SpO2 97%   BMI 30.30 kg/m  Wt Readings from Last 3 Encounters:  02/01/20 211 lb 3.2 oz (95.8 kg)  10/13/19 214 lb 9.6 oz (97.3 kg)  08/10/19 216 lb 3.2 oz (98.1 kg)     Health Maintenance Due  Topic Date Due  . PNA vac Low Risk Adult (1 of 2 - PCV13) Never done  . INFLUENZA VACCINE  01/30/2020    There are no preventive care reminders to display for this patient.  No results found for: TSH Lab Results  Component Value Date   WBC 6.1 05/11/2019   HGB 14.7 05/11/2019   HCT 44.3 05/11/2019   MCV 99.8 05/11/2019   PLT 198.0 05/11/2019   Lab Results  Component Value Date   NA 139 05/11/2019   K 4.0 05/11/2019   CO2 27 05/11/2019   GLUCOSE 109 (H) 05/11/2019   BUN 12 05/11/2019   CREATININE 0.75  05/11/2019  BILITOT 0.4 05/11/2019   ALKPHOS 92 05/11/2019   AST 18 05/11/2019   ALT 21 05/11/2019   PROT 6.4 05/11/2019   ALBUMIN 4.4 05/11/2019   CALCIUM 9.3 05/11/2019   GFR 104.41 05/11/2019   Lab Results  Component Value Date   CHOL 190 05/11/2019   Lab Results  Component Value Date   HDL 99.30 05/11/2019   Lab Results  Component Value Date   LDLCALC 82 05/11/2019   Lab Results  Component Value Date   TRIG 41.0 05/11/2019   Lab Results  Component Value Date   CHOLHDL 2 05/11/2019   No results found for: HGBA1C    Assessment & Plan:   Problem List Items Addressed This Visit      Cardiovascular and Mediastinum   Essential hypertension - Primary   Relevant Medications   amLODipine (NORVASC) 5 MG tablet   sildenafil (REVATIO) 20 MG tablet   losartan (COZAAR) 100 MG tablet   Other Relevant Orders   CBC   Basic metabolic panel   Erectile dysfunction due to arterial insufficiency   Relevant Medications   amLODipine (NORVASC) 5 MG tablet   sildenafil (REVATIO) 20 MG tablet   losartan (COZAAR) 100 MG tablet     Musculoskeletal and Integument   Actinic keratoses   Relevant Orders   Ambulatory referral to Dermatology      Meds ordered this encounter  Medications  . amLODipine (NORVASC) 5 MG tablet    Sig: Take 1 tablet (5 mg total) by mouth daily.    Dispense:  90 tablet    Refill:  0  . sildenafil (REVATIO) 20 MG tablet    Sig: May take 3-5 tablets 45 minutes prior daily as needed    Dispense:  50 tablet    Refill:  1  . losartan (COZAAR) 100 MG tablet    Sig: Take 1 tablet (100 mg total) by mouth daily.    Dispense:  90 tablet    Refill:  0    Follow-up: Return in about 3 months (around 05/03/2020).  Given information on actinic keratoses and managing hypertension.  Encouraged him with his weight loss efforts.  Congratulated him on cutting back in his alcohol intake.  Continue all medicines have added amlodipine.  Discussed Revatio's  use.  Libby Maw, MD

## 2020-02-01 NOTE — Patient Instructions (Addendum)
Managing Your Hypertension Hypertension is commonly called high blood pressure. This is when the force of your blood pressing against the walls of your arteries is too strong. Arteries are blood vessels that carry blood from your heart throughout your body. Hypertension forces the heart to work harder to pump blood, and may cause the arteries to become narrow or stiff. Having untreated or uncontrolled hypertension can cause heart attack, stroke, kidney disease, and other problems. What are blood pressure readings? A blood pressure reading consists of a higher number over a lower number. Ideally, your blood pressure should be below 120/80. The first ("top") number is called the systolic pressure. It is a measure of the pressure in your arteries as your heart beats. The second ("bottom") number is called the diastolic pressure. It is a measure of the pressure in your arteries as the heart relaxes. What does my blood pressure reading mean? Blood pressure is classified into four stages. Based on your blood pressure reading, your health care provider may use the following stages to determine what type of treatment you need, if any. Systolic pressure and diastolic pressure are measured in a unit called mm Hg. Normal  Systolic pressure: below 120.  Diastolic pressure: below 80. Elevated  Systolic pressure: 120-129.  Diastolic pressure: below 80. Hypertension stage 1  Systolic pressure: 130-139.  Diastolic pressure: 80-89. Hypertension stage 2  Systolic pressure: 140 or above.  Diastolic pressure: 90 or above. What health risks are associated with hypertension? Managing your hypertension is an important responsibility. Uncontrolled hypertension can lead to:  A heart attack.  A stroke.  A weakened blood vessel (aneurysm).  Heart failure.  Kidney damage.  Eye damage.  Metabolic syndrome.  Memory and concentration problems. What changes can I make to manage my  hypertension? Hypertension can be managed by making lifestyle changes and possibly by taking medicines. Your health care provider will help you make a plan to bring your blood pressure within a normal range. Eating and drinking   Eat a diet that is high in fiber and potassium, and low in salt (sodium), added sugar, and fat. An example eating plan is called the DASH (Dietary Approaches to Stop Hypertension) diet. To eat this way: ? Eat plenty of fresh fruits and vegetables. Try to fill half of your plate at each meal with fruits and vegetables. ? Eat whole grains, such as whole wheat pasta, brown rice, or whole grain bread. Fill about one quarter of your plate with whole grains. ? Eat low-fat diary products. ? Avoid fatty cuts of meat, processed or cured meats, and poultry with skin. Fill about one quarter of your plate with lean proteins such as fish, chicken without skin, beans, eggs, and tofu. ? Avoid premade and processed foods. These tend to be higher in sodium, added sugar, and fat.  Reduce your daily sodium intake. Most people with hypertension should eat less than 1,500 mg of sodium a day.  Limit alcohol intake to no more than 1 drink a day for nonpregnant women and 2 drinks a day for men. One drink equals 12 oz of beer, 5 oz of wine, or 1 oz of hard liquor. Lifestyle  Work with your health care provider to maintain a healthy body weight, or to lose weight. Ask what an ideal weight is for you.  Get at least 30 minutes of exercise that causes your heart to beat faster (aerobic exercise) most days of the week. Activities may include walking, swimming, or biking.  Include exercise   to strengthen your muscles (resistance exercise), such as weight lifting, as part of your weekly exercise routine. Try to do these types of exercises for 30 minutes at least 3 days a week.  Do not use any products that contain nicotine or tobacco, such as cigarettes and e-cigarettes. If you need help quitting,  ask your health care provider.  Control any long-term (chronic) conditions you have, such as high cholesterol or diabetes. Monitoring  Monitor your blood pressure at home as told by your health care provider. Your personal target blood pressure may vary depending on your medical conditions, your age, and other factors.  Have your blood pressure checked regularly, as often as told by your health care provider. Working with your health care provider  Review all the medicines you take with your health care provider because there may be side effects or interactions.  Talk with your health care provider about your diet, exercise habits, and other lifestyle factors that may be contributing to hypertension.  Visit your health care provider regularly. Your health care provider can help you create and adjust your plan for managing hypertension. Will I need medicine to control my blood pressure? Your health care provider may prescribe medicine if lifestyle changes are not enough to get your blood pressure under control, and if:  Your systolic blood pressure is 130 or higher.  Your diastolic blood pressure is 80 or higher. Take medicines only as told by your health care provider. Follow the directions carefully. Blood pressure medicines must be taken as prescribed. The medicine does not work as well when you skip doses. Skipping doses also puts you at risk for problems. Contact a health care provider if:  You think you are having a reaction to medicines you have taken.  You have repeated (recurrent) headaches.  You feel dizzy.  You have swelling in your ankles.  You have trouble with your vision. Get help right away if:  You develop a severe headache or confusion.  You have unusual weakness or numbness, or you feel faint.  You have severe pain in your chest or abdomen.  You vomit repeatedly.  You have trouble breathing. Summary  Hypertension is when the force of blood pumping  through your arteries is too strong. If this condition is not controlled, it may put you at risk for serious complications.  Your personal target blood pressure may vary depending on your medical conditions, your age, and other factors. For most people, a normal blood pressure is less than 120/80.  Hypertension is managed by lifestyle changes, medicines, or both. Lifestyle changes include weight loss, eating a healthy, low-sodium diet, exercising more, and limiting alcohol. This information is not intended to replace advice given to you by your health care provider. Make sure you discuss any questions you have with your health care provider. Document Revised: 10/09/2018 Document Reviewed: 05/15/2016 Elsevier Patient Education  [place].  Actinic Keratosis An actinic keratosis is a precancerous growth on the skin. If there is more than one growth, the condition is called actinic keratoses. Actinic keratoses appear most often on areas of skin that get a lot of sun exposure, including the scalp, face, ears, lips, upper back, forearms, and the backs of the hands. If left untreated, these growths may develop into a skin cancer called squamous cell carcinoma. It is important to have all these growths checked by a health care provider to determine the best treatment approach. What are the causes? Actinic keratoses are caused by getting too  much ultraviolet (UV) radiation from the sun or other UV light sources. What increases the risk? You are more likely to develop this condition if you:  Have light-colored skin and blue eyes.  Have blond or red hair.  Spend a lot of time in the sun.  Do not protect your skin from the sun when outdoors.  Are an older person. The risk of developing an actinic keratosis increases with age. What are the signs or symptoms? Actinic keratoses feel like scaly, rough spots of skin. Symptoms of this condition include growths that may:  Be as small as a pinhead  or as big as a quarter.  Itch, hurt, or feel sensitive.  Be skin-colored, light tan, dark tan, pink, or a combination of any of these colors. In most cases, the growths become red.  Have a small piece of pink or gray skin (skin tag) growing from them. It may be easier to notice actinic keratoses by feeling them, rather than seeing them. Sometimes, actinic keratoses disappear, but many reappear a few days to a few weeks later. How is this diagnosed? This condition is usually diagnosed with a physical exam.  A tissue sample may be removed from the actinic keratosis and examined under a microscope (biopsy). How is this treated? If needed, this condition may be treated by:  Scraping off the actinic keratosis (curettage).  Freezing the actinic keratosis with liquid nitrogen (cryosurgery). This causes the growth to eventually fall off the skin.  Applying medicated creams or gels to destroy the cells in the growth.  Applying chemicals to the actinic keratosis to make the outer layers of skin peel off (chemical peel).  Using photodynamic therapy. In this procedure, medicated cream is applied to the actinic keratosis. This cream increases your skin's sensitivity to light. Then, a strong light is aimed at the actinic keratosis to destroy cells in the growth. Follow these instructions at home: Skin care  Apply cool, wet cloths (cool compresses) to the affected areas.  Do not scratch your skin.  Check your skin regularly for any growths, especially growths that: ? Start to itch or bleed. ? Change in size, shape, or color. Caring for the treated area  Keep the treated area clean and dry as told by your health care provider.  Do not apply any medicine, cream, or lotion to the treated area unless your health care provider tells you to do that.  Do not pick at blisters or try to break them open. This can cause infection and scarring.  If you have red or irritated skin after treatment,  follow instructions from your health care provider about how to take care of the treated area. Make sure you: ? Wash your hands with soap and water before you change your bandage (dressing). If soap and water are not available, use hand sanitizer. ? Change your dressing as told by your health care provider.  If you have red or irritated skin after treatment, check your treated area every day for signs of infection. Check for: ? Redness, swelling, or pain. ? Fluid or blood. ? Warmth. ? Pus or a bad smell. General instructions  Take or apply over-the-counter and prescription medicines only as told by your health care provider.  Return to your normal activities as told by your health care provider. Ask your health care provider what activities are safe for you.  Have a skin exam done every year by a health care provider who is a skin specialist (dermatologist).  Keep all  follow-up visits as told by your health care provider. This is important. Lifestyle  Do not use any products that contain nicotine or tobacco, such as cigarettes and e-cigarettes. If you need help quitting, ask your health care provider.  Take steps to protect your skin from the sun. ? Try to avoid the sun between 10:00 a.m. and 4:00 p.m. This is when the UV light is the strongest. ? Use a sunscreen or sunblock with SPF 30 (sun protection factor 30) or greater. ? Apply sunscreen before you are exposed to sunlight and reapply as often as directed by the instructions on the sunscreen container. ? Always wear sunglasses that have UV protection, and always wear a hat and clothing to protect your skin from sunlight. ? When possible, avoid medicines that increase your sensitivity to sunlight. ? Do not use tanning beds or other indoor tanning devices. Contact a health care provider if:  You notice any changes or new growths on your skin.  You have swelling, pain, or more redness around your treated area.  You have fluid or  blood coming from your treated area.  Your treated area feels warm to the touch.  You have pus or a bad smell coming from your treated area.  You have a fever.  You have a blister that becomes large and painful. Summary  An actinic keratosis is a precancerous growth on the skin. If there is more than one growth, the condition is called actinic keratoses. In some cases, if left untreated, these growths can develop into skin cancer.  Check your skin regularly for any growths, especially growths that start to itch or bleed, or change in size, shape, or color.  Take steps to protect your skin from the sun.  Contact a health care provider if you notice any changes or new growths on your skin.  Keep all follow-up visits as told by your health care provider. This is important. This information is not intended to replace advice given to you by your health care provider. Make sure you discuss any questions you have with your health care provider. Document Revised: 10/28/2017 Document Reviewed: 10/28/2017 Elsevier Patient Education  [place].

## 2020-02-29 ENCOUNTER — Telehealth: Payer: Self-pay | Admitting: Family Medicine

## 2020-02-29 NOTE — Telephone Encounter (Signed)
Attempted to schedule AWV. Unable to LVM.  Will try at later time.  

## 2020-05-09 ENCOUNTER — Ambulatory Visit: Payer: PRIVATE HEALTH INSURANCE | Admitting: Family Medicine

## 2020-08-08 ENCOUNTER — Other Ambulatory Visit: Payer: Self-pay | Admitting: Family Medicine

## 2020-08-08 DIAGNOSIS — I10 Essential (primary) hypertension: Secondary | ICD-10-CM

## 2020-08-21 DIAGNOSIS — L819 Disorder of pigmentation, unspecified: Secondary | ICD-10-CM | POA: Diagnosis not present

## 2020-08-21 DIAGNOSIS — L249 Irritant contact dermatitis, unspecified cause: Secondary | ICD-10-CM | POA: Diagnosis not present

## 2020-08-21 DIAGNOSIS — L570 Actinic keratosis: Secondary | ICD-10-CM | POA: Diagnosis not present

## 2020-08-21 DIAGNOSIS — L812 Freckles: Secondary | ICD-10-CM | POA: Diagnosis not present

## 2020-10-11 DIAGNOSIS — Z23 Encounter for immunization: Secondary | ICD-10-CM | POA: Diagnosis not present

## 2020-10-23 ENCOUNTER — Other Ambulatory Visit: Payer: Self-pay | Admitting: Family

## 2020-10-23 DIAGNOSIS — I10 Essential (primary) hypertension: Secondary | ICD-10-CM

## 2020-11-08 ENCOUNTER — Other Ambulatory Visit: Payer: Self-pay

## 2020-11-09 ENCOUNTER — Encounter: Payer: Self-pay | Admitting: Family Medicine

## 2020-11-09 ENCOUNTER — Ambulatory Visit (INDEPENDENT_AMBULATORY_CARE_PROVIDER_SITE_OTHER): Payer: Medicare Other | Admitting: Family Medicine

## 2020-11-09 VITALS — BP 146/84 | HR 81 | Temp 97.0°F | Ht 70.0 in | Wt 199.0 lb

## 2020-11-09 DIAGNOSIS — Z87448 Personal history of other diseases of urinary system: Secondary | ICD-10-CM | POA: Diagnosis not present

## 2020-11-09 DIAGNOSIS — Z87898 Personal history of other specified conditions: Secondary | ICD-10-CM

## 2020-11-09 DIAGNOSIS — Z125 Encounter for screening for malignant neoplasm of prostate: Secondary | ICD-10-CM | POA: Diagnosis not present

## 2020-11-09 DIAGNOSIS — R7309 Other abnormal glucose: Secondary | ICD-10-CM | POA: Diagnosis not present

## 2020-11-09 DIAGNOSIS — Z0000 Encounter for general adult medical examination without abnormal findings: Secondary | ICD-10-CM

## 2020-11-09 DIAGNOSIS — E559 Vitamin D deficiency, unspecified: Secondary | ICD-10-CM | POA: Diagnosis not present

## 2020-11-09 DIAGNOSIS — I10 Essential (primary) hypertension: Secondary | ICD-10-CM | POA: Diagnosis not present

## 2020-11-09 DIAGNOSIS — L84 Corns and callosities: Secondary | ICD-10-CM | POA: Diagnosis not present

## 2020-11-09 DIAGNOSIS — Z1211 Encounter for screening for malignant neoplasm of colon: Secondary | ICD-10-CM

## 2020-11-09 MED ORDER — LOSARTAN POTASSIUM 50 MG PO TABS: ORAL_TABLET | ORAL | 1 refills | Status: DC

## 2020-11-09 NOTE — Progress Notes (Addendum)
Established Patient Office Visit  Subjective:  Patient ID: Kyle Crawford, male    DOB: [birth year]  Age: 65 y.o. MRN: [id number]  CC:  Chief Complaint  Patient presents with   Annual Exam    CPE, concerns about heel of left foot seems to be very painful x 3 months would like referral to podiatrist. Patient fasting.     HPI Kyle Crawford presents for follow-up of hypertension, vitamin D deficiency, hematuria, elevated glucose, history of alcohol use.  Sore spot on the bottom of his left foot near the MTPs.  Patient discontinued his amlodipine.  He has been able to lose some weight and his blood pressure is decreased.  Blood pressure has been normal by his machine but he is unable to give me specific numbers.  He has been taking his losartan only 50 mg in the morning and another 50 mg in the evening.  He stopped drinking completely back and March of this year.  No longer supplementing with vitamin D.  He has not smoked in years.  Past Medical History:  Diagnosis Date   Arthritis    History of Barrett's esophagus    Hypertension     Past Surgical History:  Procedure Laterality Date   arm fracture surgery     COLONOSCOPY     DENTAL SURGERY     EPIDIDYMIS SURGERY     INGUINAL HERNIA REPAIR     NERVE GRAFT     tendon repair foot/ankle      Family History  Problem Relation Age of Onset   Alzheimer's disease Father    Bladder Cancer Father    Diabetes Sister    Diabetes Maternal Grandmother    Heart attack Maternal Grandfather     Social History   Socioeconomic History   Marital status: Single    Spouse name: Not on file   Number of children: Not on file   Years of education: Not on file   Highest education level: Not on file  Occupational History   Not on file  Tobacco Use   Smoking status: Former    Pack years: 0.00   Smokeless tobacco: Never   Tobacco comments:    many years ago.  Substance and Sexual Activity   Alcohol use: Yes    Alcohol/week: 50.0  standard drinks    Types: 50 Cans of beer per week   Drug use: Yes    Frequency: 7.0 times per week    Types: Marijuana   Sexual activity: Not on file  Other Topics Concern   Not on file  Social History Narrative   Not on file   Social Determinants of Health   Financial Resource Strain: Low Risk    Difficulty of Paying Living Expenses: Not hard at all  Food Insecurity: No Food Insecurity   Worried About Charity fundraiser in the Last Year: Never true   Ran Out of Food in the Last Year: Never true  Transportation Needs: No Transportation Needs   Lack of Transportation (Medical): No   Lack of Transportation (Non-Medical): No  Physical Activity: Sufficiently Active   Days of Exercise per Week: 4 days   Minutes of Exercise per Session: 60 min  Stress: No Stress Concern Present   Feeling of Stress : Not at all  Social Connections: Socially Isolated   Frequency of Communication with Friends and Family: More than three times a week   Frequency of Social Gatherings with Friends and Family: Twice a  week   Attends Religious Services: Never   Active Member of Clubs or Organizations: No   Attends Music therapist: Never   Marital Status: Divorced  Human resources officer Violence: Not At Risk   Fear of Current or Ex-Partner: No   Emotionally Abused: No   Physically Abused: No   Sexually Abused: No    Outpatient Medications Prior to Visit  Medication Sig Dispense Refill   losartan (COZAAR) 100 MG tablet TAKE 1 TABLET BY MOUTH EVERY DAY 90 tablet 0   amLODipine (NORVASC) 5 MG tablet Take 1 tablet (5 mg total) by mouth daily. 90 tablet 0   ciprofloxacin (CIPRO) 500 MG tablet Take 1 tablet (500 mg total) by mouth 2 (two) times daily. For plantar puncture wound (Patient not taking: Reported on 02/01/2020) 14 tablet 0   sildenafil (REVATIO) 20 MG tablet May take 3-5 tablets 45 minutes prior daily as needed (Patient not taking: Reported on 11/09/2020) 50 tablet 1   No  facility-administered medications prior to visit.    Allergies  Allergen Reactions   Thimerosal     ROS Review of Systems  Constitutional: Negative.   HENT: Negative.    Eyes:  Negative for photophobia and visual disturbance.  Respiratory: Negative.    Cardiovascular: Negative.   Gastrointestinal: Negative.   Endocrine: Negative for polyphagia and polyuria.  Genitourinary: Negative.  Negative for difficulty urinating, hematuria and urgency.  Musculoskeletal:  Positive for arthralgias.  Skin:  Negative for color change and pallor.  Neurological:  Negative for weakness and numbness.  Psychiatric/Behavioral: Negative.       Objective:    Physical Exam Vitals and nursing note reviewed.  Constitutional:      General: He is not in acute distress.    Appearance: Normal appearance. He is not ill-appearing, toxic-appearing or diaphoretic.  HENT:     Head: Normocephalic and atraumatic.     Right Ear: Tympanic membrane, ear canal and external ear normal.     Left Ear: Tympanic membrane, ear canal and external ear normal.     Mouth/Throat:     Mouth: Mucous membranes are dry.     Pharynx: Oropharynx is clear. No oropharyngeal exudate or posterior oropharyngeal erythema.  Eyes:     General: No scleral icterus.       Right eye: No discharge.        Left eye: No discharge.     Extraocular Movements: Extraocular movements intact.     Conjunctiva/sclera: Conjunctivae normal.     Pupils: Pupils are equal, round, and reactive to light.  Neck:     Vascular: No carotid bruit.  Cardiovascular:     Rate and Rhythm: Normal rate and regular rhythm.     Pulses:          Dorsalis pedis pulses are 2+ on the left side.       Posterior tibial pulses are 2+ on the left side.  Pulmonary:     Effort: Pulmonary effort is normal.     Breath sounds: Normal breath sounds.  Abdominal:     General: Bowel sounds are normal.  Musculoskeletal:     Cervical back: No rigidity or tenderness.        Legs:  Lymphadenopathy:     Cervical: No cervical adenopathy.  Skin:    General: Skin is warm and dry.  Neurological:     Mental Status: He is alert and oriented to person, place, and time.  Psychiatric:  Mood and Affect: Mood normal.        Behavior: Behavior normal.    BP (!) 146/84   Pulse 81   Temp (!) 97 F (36.1 C) (Temporal)   Ht 5\' 10"  (1.778 m) Comment: 5 10" 1/2  Wt 199 lb (90.3 kg)   SpO2 98%   BMI 28.55 kg/m  Wt Readings from Last 3 Encounters:  11/09/20 199 lb (90.3 kg)  02/01/20 211 lb 3.2 oz (95.8 kg)  10/13/19 214 lb 9.6 oz (97.3 kg)     Health Maintenance Due  Topic Date Due   Zoster Vaccines- Shingrix (1 of 2) Never done   PNA vac Low Risk Adult (1 of 2 - PCV13) Never done   COVID-19 Vaccine (4 - Booster for Pfizer series) 07/12/2020    There are no preventive care reminders to display for this patient.  No results found for: TSH Lab Results  Component Value Date   WBC 5.3 11/09/2020   HGB 14.5 11/09/2020   HCT 42.7 11/09/2020   MCV 96.7 11/09/2020   PLT 206.0 11/09/2020   Lab Results  Component Value Date   NA 144 11/09/2020   K 4.3 11/09/2020   CO2 30 11/09/2020   GLUCOSE 92 11/09/2020   BUN 9 11/09/2020   CREATININE 0.82 11/09/2020   BILITOT 0.7 11/09/2020   ALKPHOS 59 11/09/2020   AST 16 11/09/2020   ALT 17 11/09/2020   PROT 6.8 11/09/2020   ALBUMIN 4.6 11/09/2020   CALCIUM 9.9 11/09/2020   GFR 91.33 11/09/2020   Lab Results  Component Value Date   CHOL 190 11/09/2020   Lab Results  Component Value Date   HDL 78.50 11/09/2020   Lab Results  Component Value Date   LDLCALC 101 (H) 11/09/2020   Lab Results  Component Value Date   TRIG 51.0 11/09/2020   Lab Results  Component Value Date   CHOLHDL 2 11/09/2020   Lab Results  Component Value Date   HGBA1C 5.6 11/09/2020      Assessment & Plan:   Problem List Items Addressed This Visit       Cardiovascular and Mediastinum   Essential hypertension -  Primary   Relevant Medications   losartan (COZAAR) 50 MG tablet   Other Relevant Orders   CBC (Completed)   Comprehensive metabolic panel (Completed)     Musculoskeletal and Integument   Corn of foot   Relevant Orders   Ambulatory referral to [place] maintenance   Relevant Orders   Lipid panel (Completed)   PSA (Completed)   Vitamin D deficiency   Relevant Orders   VITAMIN D 25 Hydroxy (Vit-D Deficiency, Fractures) (Completed)   History of alcohol use   Elevated glucose   Relevant Orders   Hemoglobin A1c (Completed)    Meds ordered this encounter  Medications   losartan (COZAAR) 50 MG tablet    Sig: Take one twice daily    Dispense:  180 tablet    Refill:  1    Follow-up: Return in about 3 months (around 02/09/2021), or bring your cuff with you..  Patient will soak his foot for the next week or so and let me know if he went like to pursue a referral to a podiatrist for treatment of his callus.  He will continue to check and record his blood pressures with the actual measured numbers and return in 3 months with his blood pressure cuff.  Libby Maw, MD

## 2020-11-09 NOTE — Patient Instructions (Addendum)
How to Take Your Blood Pressure Blood pressure is a measurement of how strongly your blood is pressing against the walls of your arteries. Arteries are blood vessels that carry blood from your heart throughout your body. Your health care provider takes your blood pressure at each office visit. You can also take your own blood pressure at home with a blood pressure monitor. You may need to take your own blood pressure to:  Confirm a diagnosis of high blood pressure (hypertension).  Monitor your blood pressure over time.  Make sure your blood pressure medicine is working. Supplies needed:  Blood pressure monitor.  Dining room chair to sit in.  Table or desk.  Small notebook and pencil or pen. How to prepare To get the most accurate reading, avoid the following for 30 minutes before you check your blood pressure:  Drinking caffeine.  Drinking alcohol.  Eating.  Smoking.  Exercising. Five minutes before you check your blood pressure:  Use the bathroom and urinate so that you have an empty bladder.  Sit quietly in a dining room chair. Do not sit in a soft couch or an armchair. Do not talk. How to take your blood pressure To check your blood pressure, follow the instructions in the manual that came with your blood pressure monitor. If you have a digital blood pressure monitor, the instructions may be as follows: 1. Sit up straight in a chair. 2. Place your feet on the floor. Do not cross your ankles or legs. 3. Rest your left arm at the level of your heart on a table or desk or on the arm of a chair. 4. Pull up your shirt sleeve. 5. Wrap the blood pressure cuff around the upper part of your left arm, 1 inch (2.5 cm) above your elbow. It is best to wrap the cuff around bare skin. 6. Fit the cuff snugly around your arm. You should be able to place only one finger between the cuff and your arm. 7. Position the cord so that it rests in the bend of your elbow. 8. Press the power  button. 9. Sit quietly while the cuff inflates and deflates. 10. Read the digital reading on the monitor screen and write the numbers down (record them) in a notebook. 11. Wait 2-3 minutes, then repeat the steps, starting at step 1.   What does my blood pressure reading mean? A blood pressure reading consists of a higher number over a lower number. Ideally, your blood pressure should be below 120/80. The first ("top") number is called the systolic pressure. It is a measure of the pressure in your arteries as your heart beats. The second ("bottom") number is called the diastolic pressure. It is a measure of the pressure in your arteries as the heart relaxes. Blood pressure is classified into five stages. The following are the stages for adults who do not have a short-term serious illness or a chronic condition. Systolic pressure and diastolic pressure are measured in a unit called mm Hg (millimeters of mercury).  Normal  Systolic pressure: below 120.  Diastolic pressure: below 80. Elevated  Systolic pressure: 120-129.  Diastolic pressure: below 80. Hypertension stage 1  Systolic pressure: 130-139.  Diastolic pressure: 80-89. Hypertension stage 2  Systolic pressure: 140 or above.  Diastolic pressure: 90 or above. You can have elevated blood pressure or hypertension even if only the systolic or only the diastolic number in your reading is higher than normal. Follow these instructions at home:  Check your blood   pressure as often as recommended by your health care provider.  Check your blood pressure at the same time every day.  Take your monitor to the next appointment with your health care provider to make sure that: ? You are using it correctly. ? It provides accurate readings.  Be sure you understand what your goal blood pressure numbers are.  Tell your health care provider if you are having any side effects from blood pressure medicine.  Keep all follow-up visits as told by  your health care provider. This is important. General tips  Your health care provider can suggest a reliable monitor that will meet your needs. There are several types of home blood pressure monitors.  Choose a monitor that has an arm cuff. Do not choose a monitor that measures your blood pressure from your wrist or finger.  Choose a cuff that wraps snugly around your upper arm. You should be able to fit only one finger between your arm and the cuff.  You can buy a blood pressure monitor at most drugstores or online. Where to find more information American Heart Association: www.heart.org Contact a health care provider if:  Your blood pressure is consistently high. Get help right away if:  Your systolic blood pressure is higher than 180.  Your diastolic blood pressure is higher than 120. Summary  Blood pressure is a measurement of how strongly your blood is pressing against the walls of your arteries.  A blood pressure reading consists of a higher number over a lower number. Ideally, your blood pressure should be below 120/80.  Check your blood pressure at the same time every day.  Avoid caffeine, alcohol, smoking, and exercise for 30 minutes prior to checking your blood pressure. These agents can affect the accuracy of the blood pressure reading. This information is not intended to replace advice given to you by your health care provider. Make sure you discuss any questions you have with your health care provider. Document Revised: 06/11/2019 Document Reviewed: 06/11/2019 Elsevier Patient Education  2021 [employer]  

## 2020-11-10 LAB — PSA: PSA: 1.41 ng/mL (ref 0.10–4.00)

## 2020-11-10 LAB — URINALYSIS, ROUTINE W REFLEX MICROSCOPIC
Bilirubin Urine: NEGATIVE
Hgb urine dipstick: NEGATIVE
Ketones, ur: NEGATIVE
Leukocytes,Ua: NEGATIVE
Nitrite: NEGATIVE
RBC / HPF: NONE SEEN (ref 0–?)
Specific Gravity, Urine: <=1.005 — AB (ref 1.000–1.030)
Total Protein, Urine: NEGATIVE
Urine Glucose: NEGATIVE
Urobilinogen, UA: 0.2 (ref 0.0–1.0)
WBC, UA: NONE SEEN (ref 0–?)
pH: 6.5 (ref 5.0–8.0)

## 2020-11-10 LAB — COMPREHENSIVE METABOLIC PANEL
ALT: 17 U/L (ref 0–53)
AST: 16 U/L (ref 0–37)
Albumin: 4.6 g/dL (ref 3.5–5.2)
Alkaline Phosphatase: 59 U/L (ref 39–117)
BUN: 9 mg/dL (ref 6–23)
CO2: 30 mEq/L (ref 19–32)
Calcium: 9.9 mg/dL (ref 8.4–10.5)
Chloride: 107 mEq/L (ref 96–112)
Creatinine, Ser: 0.82 mg/dL (ref 0.40–1.50)
GFR: 91.33 mL/min (ref >60.00)
Glucose, Bld: 92 mg/dL (ref 70–99)
Potassium: 4.3 mEq/L (ref 3.5–5.1)
Sodium: 144 mEq/L (ref 135–145)
Total Bilirubin: 0.7 mg/dL (ref 0.2–1.2)
Total Protein: 6.8 g/dL (ref 6.0–8.3)

## 2020-11-10 LAB — LIPID PANEL
Cholesterol: 190 mg/dL (ref 0–200)
HDL: 78.5 mg/dL (ref >39.00)
LDL Cholesterol: 101 mg/dL — ABNORMAL HIGH (ref 0–99)
NonHDL: 111.46
Total CHOL/HDL Ratio: 2
Triglycerides: 51 mg/dL (ref 0.0–149.0)
VLDL: 10.2 mg/dL (ref 0.0–40.0)

## 2020-11-10 LAB — CBC
HCT: 42.7 % (ref 39.0–52.0)
Hemoglobin: 14.5 g/dL (ref 13.0–17.0)
MCHC: 33.9 g/dL (ref 30.0–36.0)
MCV: 96.7 fl (ref 78.0–100.0)
Platelets: 206 10*3/uL (ref 150.0–400.0)
RBC: 4.41 Mil/uL (ref 4.22–5.81)
RDW: 12.6 % (ref 11.5–15.5)
WBC: 5.3 10*3/uL (ref 4.0–10.5)

## 2020-11-10 LAB — VITAMIN D 25 HYDROXY (VIT D DEFICIENCY, FRACTURES): VITD: 22.71 ng/mL — ABNORMAL LOW (ref 30.00–100.00)

## 2020-11-10 LAB — HEMOGLOBIN A1C: Hgb A1c MFr Bld: 5.6 % (ref 4.6–6.5)

## 2020-12-11 NOTE — Progress Notes (Addendum)
Subjective:   Kyle Crawford is a 65 y.o. male who presents for an Initial Medicare Annual Wellness Visit.  I connected with  Kyle Crawford on 12/12/20 by a telephone enabled telemedicine application and verified that I am speaking with the correct person using two identifiers.   I discussed the limitations of evaluation and management by telemedicine. The patient expressed understanding and agreed to proceed.  Patient location: home  Provider location: Telehealth  I provided 30 minutes of non face - to - face time during this encounter.   Review of Systems    NA Cardiac Risk Factors include: advanced age (>86men, >73 women);dyslipidemia;male gender     Objective:    There were no vitals filed for this visit. There is no height or weight on file to calculate BMI.  Advanced Directives 12/12/2020  Does Patient Have a Medical Advance Directive? No  Would patient like information on creating a medical advance directive? No - Patient declined    Current Medications (verified) Outpatient Encounter Medications as of 12/12/2020  Medication Sig   losartan (COZAAR) 50 MG tablet Take one twice daily   No facility-administered encounter medications on file as of 12/12/2020.    Allergies (verified) Thimerosal   History: Past Medical History:  Diagnosis Date   Arthritis    History of Barrett's esophagus    Hypertension    Past Surgical History:  Procedure Laterality Date   arm fracture surgery     COLONOSCOPY     DENTAL SURGERY     EPIDIDYMIS SURGERY     INGUINAL HERNIA REPAIR     NERVE GRAFT     tendon repair foot/ankle     Family History  Problem Relation Age of Onset   Alzheimer's disease Father    Bladder Cancer Father    Diabetes Sister    Diabetes Maternal Grandmother    Heart attack Maternal Grandfather    Social History   Socioeconomic History   Marital status: Single    Spouse name: Not on file   Number of children: Not on file   Years of  education: Not on file   Highest education level: Not on file  Occupational History   Not on file  Tobacco Use   Smoking status: Former    Pack years: 0.00   Smokeless tobacco: Never   Tobacco comments:    many years ago.  Substance and Sexual Activity   Alcohol use: Yes    Alcohol/week: 50.0 standard drinks    Types: 50 Cans of beer per week   Drug use: Yes    Frequency: 7.0 times per week    Types: Marijuana   Sexual activity: Not on file  Other Topics Concern   Not on file  Social History Narrative   Not on file   Social Determinants of Health   Financial Resource Strain: Low Risk    Difficulty of Paying Living Expenses: Not hard at all  Food Insecurity: No Food Insecurity   Worried About Charity fundraiser in the Last Year: Never true   Ran Out of Food in the Last Year: Never true  Transportation Needs: No Transportation Needs   Lack of Transportation (Medical): No   Lack of Transportation (Non-Medical): No  Physical Activity: Sufficiently Active   Days of Exercise per Week: 4 days   Minutes of Exercise per Session: 60 min  Stress: No Stress Concern Present   Feeling of Stress : Not at all  Social Connections: Socially  Isolated   Frequency of Communication with Friends and Family: More than three times a week   Frequency of Social Gatherings with Friends and Family: Twice a week   Attends Religious Services: Never   Marine scientist or Organizations: No   Attends Archivist Meetings: Never   Marital Status: Divorced    Tobacco Counseling Counseling given: Not Answered Tobacco comments: many years ago.   Clinical Intake:  Pre-visit preparation completed: Yes  Pain : 0-10 Pain Location: Back Pain Orientation: Right Pain Descriptors / Indicators: Aching, Sore Pain Onset: More than a month ago Pain Frequency: Intermittent Pain Relieving Factors: aleve Effect of Pain on Daily Activities: yes  Pain Relieving Factors: aleve  Nutritional  Risks: None Diabetes: No  How often do you need to have someone help you when you read instructions, pamphlets, or other written materials from your doctor or pharmacy?: 1 - Never  Diabetic no  Interpreter Needed?: No  Information entered by :: Leroy Kennedy LPN   Activities of Daily Living In your present state of health, do you have any difficulty performing the following activities: 12/12/2020  Hearing? N  Vision? N  Difficulty concentrating or making decisions? N  Walking or climbing stairs? N  Dressing or bathing? N  Doing errands, shopping? N  Preparing Food and eating ? N  Using the Toilet? N  In the past six months, have you accidently leaked urine? N  Do you have problems with loss of bowel control? N  Managing your Medications? N  Managing your Finances? N  Some recent data might be hidden    Patient Care Team: Libby Maw, MD as PCP - General (Family Medicine)  Indicate any recent Medical Services you may have received from other than [facility] providers in the past year (date may be approximate).     Assessment:   This is a routine wellness examination for Kyle Crawford.  Hearing/Vision screen Hearing Screening - Comments:: Will be having hearing test done Vision Screening - Comments:: Patient had Lasix in 1991.  Will schedule Eye Exam  Dietary issues and exercise activities discussed: Current Exercise Habits: Home exercise routine, Type of exercise: walking, Time (Minutes): 50, Frequency (Times/Week): 3, Weekly Exercise (Minutes/Week): 150, Intensity: Moderate   Goals Addressed             This Visit's Progress    Patient Stated       Get bmi below 25 loose 12 pounds        Depression Screen PHQ 2/9 Scores 12/12/2020 11/09/2020 11/09/2020  PHQ - 2 Score 0 0 0  PHQ- 9 Score - 0 -    Fall Risk Fall Risk  12/12/2020 11/09/2020  Falls in the past year? 0 0  Number falls in past yr: 0 -  Injury with Fall? 0 -  Follow up Falls evaluation  completed;Falls prevention discussed -    FALL RISK PREVENTION PERTAINING TO THE HOME:  Any stairs in or around the home? Yes  If so, are there any without handrails? Yes  Home free of loose throw rugs in walkways, pet beds, electrical cords, etc? Yes  Adequate lighting in your home to reduce risk of falls? Yes   ASSISTIVE DEVICES UTILIZED TO PREVENT FALLS:  Life alert? No  Use of a cane, walker or w/c? No  Grab bars in the bathroom? Yes  Shower chair or bench in shower? No  Elevated toilet seat or a handicapped toilet? Yes   TIMED UP AND GO:  Was the test performed? No .    Cognitive Function:  Normal cognitive status assessed by direct observation by this Nurse Health Advisor. No abnormalities found.          Immunizations Immunization History  Administered Date(s) Administered   Fluad Quad(high Dose 65+) 05/06/2019   PFIZER(Purple Top)SARS-COV-2 Vaccination 08/07/2019, 09/01/2019, 04/11/2020   Tdap 06/20/2010, 11/18/2017    TDAP status: Due, Education has been provided regarding the importance of this vaccine. Advised may receive this vaccine at local pharmacy or Health Dept. Aware to provide a copy of the vaccination record if obtained from local pharmacy or Health Dept. Verbalized acceptance and understanding.  Flu Vaccine status: Due, Education has been provided regarding the importance of this vaccine. Advised may receive this vaccine at local pharmacy or Health Dept. Aware to provide a copy of the vaccination record if obtained from local pharmacy or Health Dept. Verbalized acceptance and understanding.  Pneumococcal vaccine status: Due, Education has been provided regarding the importance of this vaccine. Advised may receive this vaccine at local pharmacy or Health Dept. Aware to provide a copy of the vaccination record if obtained from local pharmacy or Health Dept. Verbalized acceptance and understanding.  Covid-19 vaccine status: Information provided on how to  obtain vaccines.   Qualifies for Shingles Vaccine? Yes   Zostavax completed No   Shingrix Completed?: No.    Education has been provided regarding the importance of this vaccine. Patient has been advised to call insurance company to determine out of pocket expense if they have not yet received this vaccine. Advised may also receive vaccine at local pharmacy or Health Dept. Verbalized acceptance and understanding.  Screening Tests Health Maintenance  Topic Date Due   Zoster Vaccines- Shingrix (1 of 2) Never done   PNA vac Low Risk Adult (1 of 2 - PCV13) Never done   COVID-19 Vaccine (4 - Booster for Pfizer series) 07/12/2020   INFLUENZA VACCINE  01/29/2021   COLONOSCOPY (Pts 45-80yrs Insurance coverage will need to be confirmed)  04/11/2026   TETANUS/TDAP  11/19/2027   Hepatitis C Screening  Completed   HPV VACCINES  Aged Out    Health Maintenance  Health Maintenance Due  Topic Date Due   Zoster Vaccines- Shingrix (1 of 2) Never done   PNA vac Low Risk Adult (1 of 2 - PCV13) Never done   COVID-19 Vaccine (4 - Booster for Pfizer series) 07/12/2020    Colorectal cancer screening: Referral to GI placed  . Pt aware the office will call re: appt.  Lung Cancer Screening: (Low Dose CT Chest recommended if Age 25-80 years, 30 pack-year currently smoking OR have quit w/in 15years.) does not qualify.   Lung Cancer Screening Referral: na  Additional Screening:  Hepatitis C Screening: does not qualify; Completed 05-11-2019  Vision Screening: Recommended annual ophthalmology exams for early detection of glaucoma and other disorders of the eye. Is the patient up to date with their annual eye exam?  No  Who is the provider or what is the name of the office in which the patient attends annual eye exams? Patient will call and schedule with a provider If pt is not established with a provider, would they like to be referred to a provider to establish care? No .   Dental Screening: Recommended  annual dental exams for proper oral hygiene  Community Resource Referral / Chronic Care Management: CRR required this visit?  No   CCM required this visit?  No  Plan:     I have personally reviewed and noted the following in the patient's chart:   Medical and social history Use of alcohol, tobacco or illicit drugs  Current medications and supplements including opioid prescriptions. Patient is not currently taking opioid prescriptions. Functional ability and status Nutritional status Physical activity Advanced directives List of other physicians Hospitalizations, surgeries, and ER visits in previous 12 months Vitals Screenings to include cognitive, depression, and falls Referrals and appointments  In addition, I have reviewed and discussed with patient certain preventive protocols, quality metrics, and best practice recommendations. A written personalized care plan for preventive services as well as general preventive health recommendations were provided to patient.     Leroy Kennedy, LPN   02/15/5630   Nurse Notes: NA       Subjective:   Kyle Crawford is a 65 y.o. male who presents for Medicare Annual/Subsequent preventive examination.  I connected with  Kyle Crawford on 12/12/20 by a telephone enabled telemedicine application and verified that I am speaking with the correct person using two identifiers.   I discussed the limitations of evaluation and management by telemedicine. The patient expressed understanding and agreed to proceed.   Review of Systems    NA  Cardiac Risk Factors include: advanced age (>47men, >25 women);dyslipidemia;male gender     Objective:    There were no vitals filed for this visit. There is no height or weight on file to calculate BMI.  Advanced Directives 12/12/2020  Does Patient Have a Medical Advance Directive? No  Would patient like information on creating a medical advance directive? No - Patient declined    Current  Medications (verified) Outpatient Encounter Medications as of 12/12/2020  Medication Sig   losartan (COZAAR) 50 MG tablet Take one twice daily   No facility-administered encounter medications on file as of 12/12/2020.    Allergies (verified) Thimerosal   History: Past Medical History:  Diagnosis Date   Arthritis    History of Barrett's esophagus    Hypertension    Past Surgical History:  Procedure Laterality Date   arm fracture surgery     COLONOSCOPY     DENTAL SURGERY     EPIDIDYMIS SURGERY     INGUINAL HERNIA REPAIR     NERVE GRAFT     tendon repair foot/ankle     Family History  Problem Relation Age of Onset   Alzheimer's disease Father    Bladder Cancer Father    Diabetes Sister    Diabetes Maternal Grandmother    Heart attack Maternal Grandfather    Social History   Socioeconomic History   Marital status: Single    Spouse name: Not on file   Number of children: Not on file   Years of education: Not on file   Highest education level: Not on file  Occupational History   Not on file  Tobacco Use   Smoking status: Former    Pack years: 0.00   Smokeless tobacco: Never   Tobacco comments:    many years ago.  Substance and Sexual Activity   Alcohol use: Yes    Alcohol/week: 50.0 standard drinks    Types: 50 Cans of beer per week   Drug use: Yes    Frequency: 7.0 times per week    Types: Marijuana   Sexual activity: Not on file  Other Topics Concern   Not on file  Social History Narrative   Not on file   Social Determinants of Health  Financial Resource Strain: Low Risk    Difficulty of Paying Living Expenses: Not hard at all  Food Insecurity: No Food Insecurity   Worried About Charity fundraiser in the Last Year: Never true   Ran Out of Food in the Last Year: Never true  Transportation Needs: No Transportation Needs   Lack of Transportation (Medical): No   Lack of Transportation (Non-Medical): No  Physical Activity: Sufficiently Active   Days  of Exercise per Week: 4 days   Minutes of Exercise per Session: 60 min  Stress: No Stress Concern Present   Feeling of Stress : Not at all  Social Connections: Socially Isolated   Frequency of Communication with Friends and Family: More than three times a week   Frequency of Social Gatherings with Friends and Family: Twice a week   Attends Religious Services: Never   Marine scientist or Organizations: No   Attends Archivist Meetings: Never   Marital Status: Divorced    Tobacco Counseling Counseling given: Not Answered Tobacco comments: many years ago.   Clinical Intake:  Pre-visit preparation completed: Yes  Pain : 0-10 Pain Location: Back Pain Orientation: Right Pain Descriptors / Indicators: Aching, Sore Pain Onset: More than a month ago Pain Frequency: Intermittent Pain Relieving Factors: aleve Effect of Pain on Daily Activities: yes  Pain Relieving Factors: aleve  Nutritional Risks: None Diabetes: No  How often do you need to have someone help you when you read instructions, pamphlets, or other written materials from your doctor or pharmacy?: 1 - Never  Diabetic?no  Interpreter Needed?: No  Information entered by :: Leroy Kennedy LPN   Activities of Daily Living In your present state of health, do you have any difficulty performing the following activities: 12/12/2020  Hearing? N  Vision? N  Difficulty concentrating or making decisions? N  Walking or climbing stairs? N  Dressing or bathing? N  Doing errands, shopping? N  Preparing Food and eating ? N  Using the Toilet? N  In the past six months, have you accidently leaked urine? N  Do you have problems with loss of bowel control? N  Managing your Medications? N  Managing your Finances? N  Some recent data might be hidden    Patient Care Team: Libby Maw, MD as PCP - General (Family Medicine)  Indicate any recent Medical Services you may have received from other than [facility]  providers in the past year (date may be approximate).     Assessment:   This is a routine wellness examination for Kyle Crawford.  Hearing/Vision screen Hearing Screening - Comments:: Will be having hearing test done Vision Screening - Comments:: Patient had Lasix in 1991.  Will schedule Eye Exam  Dietary issues and exercise activities discussed: Current Exercise Habits: Home exercise routine, Type of exercise: walking, Time (Minutes): 50, Frequency (Times/Week): 3, Weekly Exercise (Minutes/Week): 150, Intensity: Moderate   Goals Addressed             This Visit's Progress    Patient Stated       Get bmi below 25 loose 12 pounds       Depression Screen PHQ 2/9 Scores 12/12/2020 11/09/2020 11/09/2020  PHQ - 2 Score 0 0 0  PHQ- 9 Score - 0 -    Fall Risk Fall Risk  12/12/2020 11/09/2020  Falls in the past year? 0 0  Number falls in past yr: 0 -  Injury with Fall? 0 -  Follow up Falls evaluation completed;Falls  prevention discussed -    FALL RISK PREVENTION PERTAINING TO THE HOME:  Any stairs in or around the home? Yes  If so, are there any without handrails? Yes  Home free of loose throw rugs in walkways, pet beds, electrical cords, etc? Yes  Adequate lighting in your home to reduce risk of falls? Yes   ASSISTIVE DEVICES UTILIZED TO PREVENT FALLS:  Life alert? No  Use of a cane, walker or w/c? No  Grab bars in the bathroom? Yes  Shower chair or bench in shower? No  Elevated toilet seat or a handicapped toilet? No   TIMED UP AND GO:  Was the test performed? No .  Telehealth    Cognitive Function: Normal cognitive status assessed by direct observation by this Nurse Health Advisor. No abnormalities found.          Immunizations Immunization History  Administered Date(s) Administered   Fluad Quad(high Dose 65+) 05/06/2019   PFIZER(Purple Top)SARS-COV-2 Vaccination 08/07/2019, 09/01/2019, 04/11/2020   Tdap 06/20/2010, 11/18/2017    TDAP status: Up to  date  Flu Vaccine status: Due, Education has been provided regarding the importance of this vaccine. Advised may receive this vaccine at local pharmacy or Health Dept. Aware to provide a copy of the vaccination record if obtained from local pharmacy or Health Dept. Verbalized acceptance and understanding.  Pneumococcal vaccine status: Due, Education has been provided regarding the importance of this vaccine. Advised may receive this vaccine at local pharmacy or Health Dept. Aware to provide a copy of the vaccination record if obtained from local pharmacy or Health Dept. Verbalized acceptance and understanding.  Covid-19 vaccine status: Completed vaccines  Qualifies for Shingles Vaccine? Yes   Zostavax completed No   Shingrix Completed?: No.    Education has been provided regarding the importance of this vaccine. Patient has been advised to call insurance company to determine out of pocket expense if they have not yet received this vaccine. Advised may also receive vaccine at local pharmacy or Health Dept. Verbalized acceptance and understanding.  Screening Tests Health Maintenance  Topic Date Due   Zoster Vaccines- Shingrix (1 of 2) Never done   PNA vac Low Risk Adult (1 of 2 - PCV13) Never done   COVID-19 Vaccine (4 - Booster for Pfizer series) 07/12/2020   INFLUENZA VACCINE  01/29/2021   COLONOSCOPY (Pts 45-79yrs Insurance coverage will need to be confirmed)  04/11/2026   TETANUS/TDAP  11/19/2027   Hepatitis C Screening  Completed   HPV VACCINES  Aged Out    Health Maintenance  Health Maintenance Due  Topic Date Due   Zoster Vaccines- Shingrix (1 of 2) Never done   PNA vac Low Risk Adult (1 of 2 - PCV13) Never done   COVID-19 Vaccine (4 - Booster for Pfizer series) 07/12/2020    Colorectal cancer screening: Referral to GI placed  . Pt aware the office will call re: appt.  Lung Cancer Screening: (Low Dose CT Chest recommended if Age 37-80 years, 30 pack-year currently smoking OR  have quit w/in 15years.) does not qualify.   Lung Cancer Screening Referral: na  Additional Screening:  Hepatitis C Screening: does not qualify; Completed 05-11-2019  Vision Screening: Recommended annual ophthalmology exams for early detection of glaucoma and other disorders of the eye. Is the patient up to date with their annual eye exam?  No  Who is the provider or what is the name of the office in which the patient attends annual eye exams?  If pt  is not established with a provider, would they like to be referred to a provider to establish care? No .   Dental Screening: Recommended annual dental exams for proper oral hygiene  Community Resource Referral / Chronic Care Management: CRR required this visit?  No   CCM required this visit?  No      Plan:     I have personally reviewed and noted the following in the patient's chart:   Medical and social history Use of alcohol, tobacco or illicit drugs  Current medications and supplements including opioid prescriptions. Patient is not currently taking opioid prescriptions. Functional ability and status Nutritional status Physical activity Advanced directives List of other physicians Hospitalizations, surgeries, and ER visits in previous 12 months Vitals Screenings to include cognitive, depression, and falls Referrals and appointments  In addition, I have reviewed and discussed with patient certain preventive protocols, quality metrics, and best practice recommendations. A written personalized care plan for preventive services as well as general preventive health recommendations were provided to patient.     Leroy Kennedy, LPN   [id number]/41/[id number]   Nurse Notes:  NA

## 2020-12-12 ENCOUNTER — Telehealth: Payer: Self-pay | Admitting: *Deleted

## 2020-12-12 ENCOUNTER — Ambulatory Visit (INDEPENDENT_AMBULATORY_CARE_PROVIDER_SITE_OTHER): Payer: Medicare Other | Admitting: *Deleted

## 2020-12-12 DIAGNOSIS — Z0000 Encounter for general adult medical examination without abnormal findings: Secondary | ICD-10-CM | POA: Diagnosis not present

## 2020-12-12 DIAGNOSIS — Z1211 Encounter for screening for malignant neoplasm of colon: Secondary | ICD-10-CM

## 2020-12-12 NOTE — Addendum Note (Signed)
Addended by: Jon Billings on: 12/12/2020 05:07 PM   Modules accepted: Orders

## 2020-12-12 NOTE — Patient Instructions (Signed)
Kyle Crawford , Thank you for taking time to come for your Medicare Wellness Visit. I appreciate your ongoing commitment to your health goals. Please review the following plan we discussed and let me know if I can assist you in the future.   Screening recommendations/referrals: Colonoscopy: referral placed/  Recommended yearly ophthalmology/optometry visit for glaucoma screening and checkup Recommended yearly dental visit for hygiene and checkup  Vaccinations: Influenza vaccine: Education provided Pneumococcal vaccine: Education provided Tdap vaccine: up to date Shingles vaccine: Education provided  Advanced directives: No Copy requested once completed  Conditions/risks identified:   Next appointment: 02-09-2021 2 10:30 Dr. Ethelene Hal  Preventive Care 67 Years and Older, Male Preventive care refers to lifestyle choices and visits with your health care provider that can promote health and wellness. What does preventive care include? A yearly physical exam. This is also called an annual well check. Dental exams once or twice a year. Routine eye exams. Ask your health care provider how often you should have your eyes checked. Personal lifestyle choices, including: Daily care of your teeth and gums. Regular physical activity. Eating a healthy diet. Avoiding tobacco and drug use. Limiting alcohol use. Practicing safe sex. Taking low doses of aspirin every day. Taking vitamin and mineral supplements as recommended by your health care provider. What happens during an annual well check? The services and screenings done by your health care provider during your annual well check will depend on your age, overall health, lifestyle risk factors, and family history of disease. Counseling  Your health care provider may ask you questions about your: Alcohol use. Tobacco use. Drug use. Emotional well-being. Home and relationship well-being. Sexual activity. Eating habits. History of  falls. Memory and ability to understand (cognition). Work and work Statistician. Screening  You may have the following tests or measurements: Height, weight, and BMI. Blood pressure. Lipid and cholesterol levels. These may be checked every 5 years, or more frequently if you are over 48 years old. Skin check. Lung cancer screening. You may have this screening every year starting at age 65 if you have a 30-pack-year history of smoking and currently smoke or have quit within the past 15 years. Fecal occult blood test (FOBT) of the stool. You may have this test every year starting at age 17. Flexible sigmoidoscopy or colonoscopy. You may have a sigmoidoscopy every 5 years or a colonoscopy every 10 years starting at age 80. Prostate cancer screening. Recommendations will vary depending on your family history and other risks. Hepatitis C blood test. Hepatitis B blood test. Sexually transmitted disease (STD) testing. Diabetes screening. This is done by checking your blood sugar (glucose) after you have not eaten for a while (fasting). You may have this done every 1-3 years. Abdominal aortic aneurysm (AAA) screening. You may need this if you are a current or former smoker. Osteoporosis. You may be screened starting at age 96 if you are at high risk. Talk with your health care provider about your test results, treatment options, and if necessary, the need for more tests. Vaccines  Your health care provider may recommend certain vaccines, such as: Influenza vaccine. This is recommended every year. Tetanus, diphtheria, and acellular pertussis (Tdap, Td) vaccine. You may need a Td booster every 10 years. Zoster vaccine. You may need this after age 73. Pneumococcal 13-valent conjugate (PCV13) vaccine. One dose is recommended after age 77. Pneumococcal polysaccharide (PPSV23) vaccine. One dose is recommended after age 57. Talk to your health care provider about which screenings and vaccines  you need and  how often you need them. This information is not intended to replace advice given to you by your health care provider. Make sure you discuss any questions you have with your health care provider. Document Released: 07/14/2015 Document Revised: 03/06/2016 Document Reviewed: 04/18/2015 Elsevier Interactive Patient Education  2017 [employer] Prevention in the Home Falls can cause injuries. They can happen to people of all ages. There are many things you can do to make your home safe and to help prevent falls. What can I do on the outside of my home? Regularly fix the edges of walkways and driveways and fix any cracks. Remove anything that might make you trip as you walk through a door, such as a raised step or threshold. Trim any bushes or trees on the path to your home. Use bright outdoor lighting. Clear any walking paths of anything that might make someone trip, such as rocks or tools. Regularly check to see if handrails are loose or broken. Make sure that both sides of any steps have handrails. Any raised decks and porches should have guardrails on the edges. Have any leaves, snow, or ice cleared regularly. Use sand or salt on walking paths during winter. Clean up any spills in your garage right away. This includes oil or grease spills. What can I do in the bathroom? Use night lights. Install grab bars by the toilet and in the tub and shower. Do not use towel bars as grab bars. Use non-skid mats or decals in the tub or shower. If you need to sit down in the shower, use a plastic, non-slip stool. Keep the floor dry. Clean up any water that spills on the floor as soon as it happens. Remove soap buildup in the tub or shower regularly. Attach bath mats securely with double-sided non-slip rug tape. Do not have throw rugs and other things on the floor that can make you trip. What can I do in the bedroom? Use night lights. Make sure that you have a light by your bed that is easy to  reach. Do not use any sheets or blankets that are too big for your bed. They should not hang down onto the floor. Have a firm chair that has side arms. You can use this for support while you get dressed. Do not have throw rugs and other things on the floor that can make you trip. What can I do in the kitchen? Clean up any spills right away. Avoid walking on wet floors. Keep items that you use a lot in easy-to-reach places. If you need to reach something above you, use a strong step stool that has a grab bar. Keep electrical cords out of the way. Do not use floor polish or wax that makes floors slippery. If you must use wax, use non-skid floor wax. Do not have throw rugs and other things on the floor that can make you trip. What can I do with my stairs? Do not leave any items on the stairs. Make sure that there are handrails on both sides of the stairs and use them. Fix handrails that are broken or loose. Make sure that handrails are as long as the stairways. Check any carpeting to make sure that it is firmly attached to the stairs. Fix any carpet that is loose or worn. Avoid having throw rugs at the top or bottom of the stairs. If you do have throw rugs, attach them to the floor with carpet tape. Make sure  that you have a light switch at the top of the stairs and the bottom of the stairs. If you do not have them, ask someone to add them for you. What else can I do to help prevent falls? Wear shoes that: Do not have high heels. Have rubber bottoms. Are comfortable and fit you well. Are closed at the toe. Do not wear sandals. If you use a stepladder: Make sure that it is fully opened. Do not climb a closed stepladder. Make sure that both sides of the stepladder are locked into place. Ask someone to hold it for you, if possible. Clearly mark and make sure that you can see: Any grab bars or handrails. First and last steps. Where the edge of each step is. Use tools that help you move  around (mobility aids) if they are needed. These include: Canes. Walkers. Scooters. Crutches. Turn on the lights when you go into a dark area. Replace any light bulbs as soon as they burn out. Set up your furniture so you have a clear path. Avoid moving your furniture around. If any of your floors are uneven, fix them. If there are any pets around you, be aware of where they are. Review your medicines with your doctor. Some medicines can make you feel dizzy. This can increase your chance of falling. Ask your doctor what other things that you can do to help prevent falls. This information is not intended to replace advice given to you by your health care provider. Make sure you discuss any questions you have with your health care provider. Document Released: 04/13/2009 Document Revised: 11/23/2015 Document Reviewed: 07/22/2014 Elsevier Interactive Patient Education  2017 [employer].

## 2020-12-12 NOTE — Telephone Encounter (Signed)
Kyle Crawford,  Would like a referral to Podiatry for a callus on his foot.  He stated he spoke with Dr. Ethelene Hal about this at his last visit.      Patient would like for someone to reach out to him if this can be done without having to come back into the office

## 2020-12-14 ENCOUNTER — Other Ambulatory Visit: Payer: Self-pay

## 2020-12-14 ENCOUNTER — Ambulatory Visit (INDEPENDENT_AMBULATORY_CARE_PROVIDER_SITE_OTHER): Payer: Medicare Other | Admitting: Podiatry

## 2020-12-14 DIAGNOSIS — R52 Pain, unspecified: Secondary | ICD-10-CM | POA: Diagnosis not present

## 2020-12-14 DIAGNOSIS — M205X1 Other deformities of toe(s) (acquired), right foot: Secondary | ICD-10-CM

## 2020-12-14 DIAGNOSIS — L84 Corns and callosities: Secondary | ICD-10-CM | POA: Diagnosis not present

## 2020-12-14 NOTE — Patient Instructions (Signed)
Look for urea 40% cream or ointment and apply to the thickened dry skin / calluses. This can be bought over the counter, at a pharmacy or online such as [employer].  

## 2020-12-18 ENCOUNTER — Encounter: Payer: Self-pay | Admitting: Podiatry
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
## 2021-01-04 ENCOUNTER — Telehealth: Payer: Self-pay | Admitting: Gastroenterology

## 2021-01-04 NOTE — Telephone Encounter (Signed)
Hi Dr. Rush Landmark, we have received a referral from patient's PCP for a repeat colon. Patient had colon in 2017. Records have been received and they will be sent to you for review. Please advise on scheduling. Thank you.

## 2021-01-04 NOTE — Telephone Encounter (Signed)
Review of outside records that will be scanned into the chart  2017 EGD Schatzki's ring was found in the GE junction.  Small hiatal hernia was seen.  Normal duodenum.  2017 colonoscopy Multiple diverticula were seen in the ascending colon.  Mild in severity.  A single 5 mm polyp was found in the ascending colon removed with piecemeal polypectomy.  A single 5 mm polyp was found in the descending colon removed with piecemeal polypectomy.  Grade 2 internal hemorrhoids were noted.  Pathology consistent with tubular adenomas.  Based on prior recommendations at that time a 5-year colonoscopy would be recommended.  At this point, reasonable to move forward with the previous planned follow-up and get patient up-to-date with current guidelines after colonoscopy is complete. Okay to move forward with direct colonoscopy. If patient wants to be seen in clinic first that is fine as well. Thanks. GM

## 2021-01-05 NOTE — Telephone Encounter (Signed)
Left message to call back  

## 2021-02-05 ENCOUNTER — Encounter: Payer: Self-pay | Admitting: Family Medicine

## 2021-02-06 NOTE — Addendum Note (Signed)
Addended by: Lynda Rainwater on: 02/06/2021 08:46 AM   Modules accepted: Orders

## 2021-02-09 ENCOUNTER — Encounter: Payer: Self-pay | Admitting: Family Medicine

## 2021-02-09 ENCOUNTER — Other Ambulatory Visit: Payer: Self-pay

## 2021-02-09 ENCOUNTER — Ambulatory Visit (INDEPENDENT_AMBULATORY_CARE_PROVIDER_SITE_OTHER): Payer: Medicare Other | Admitting: Family Medicine

## 2021-02-09 VITALS — BP 138/78 | HR 61 | Temp 97.3°F | Ht 70.0 in | Wt 183.8 lb

## 2021-02-09 DIAGNOSIS — I10 Essential (primary) hypertension: Secondary | ICD-10-CM

## 2021-02-09 DIAGNOSIS — E559 Vitamin D deficiency, unspecified: Secondary | ICD-10-CM

## 2021-02-09 LAB — LIPID PANEL
Cholesterol: 224 mg/dL — ABNORMAL HIGH (ref 0–200)
HDL: 94.4 mg/dL (ref >39.00)
LDL Cholesterol: 118 mg/dL — ABNORMAL HIGH (ref 0–99)
NonHDL: 129.87
Total CHOL/HDL Ratio: 2
Triglycerides: 60 mg/dL (ref 0.0–149.0)
VLDL: 12 mg/dL (ref 0.0–40.0)

## 2021-02-09 LAB — VITAMIN D 25 HYDROXY (VIT D DEFICIENCY, FRACTURES): VITD: 28.1 ng/mL — ABNORMAL LOW (ref 30.00–100.00)

## 2021-02-09 MED ORDER — LOSARTAN POTASSIUM 50 MG PO TABS: ORAL_TABLET | ORAL | 3 refills | Status: DC

## 2021-02-09 NOTE — Progress Notes (Signed)
Established Patient Office Visit  Subjective:  Patient ID: Kyle Crawford, male    DOB: 1956/04/25  Age: 65 y.o. MRN: [id number]  CC:  Chief Complaint  Patient presents with   Follow-up    3 month follow up on BP, no concerns patient fasting for labs.     HPI Kyle Crawford presents for follow-up of hypertension and vitamin D D deficiency.  Has made an amazing transformation in his life.  He has been able to lose approximately 40 pounds by avoiding carbohydrates and exercising.  Feels great.  Blood pressure has been running in the 120 to 140 range/60-80s.   Past Medical History:  Diagnosis Date   Arthritis    History of Barrett's esophagus    Hypertension     Past Surgical History:  Procedure Laterality Date   arm fracture surgery     COLONOSCOPY     DENTAL SURGERY     EPIDIDYMIS SURGERY     INGUINAL HERNIA REPAIR     NERVE GRAFT     tendon repair foot/ankle      Family History  Problem Relation Age of Onset   Alzheimer's disease Father    Bladder Cancer Father    Diabetes Sister    Diabetes Maternal Grandmother    Heart attack Maternal Grandfather     Social History   Socioeconomic History   Marital status: Single    Spouse name: Not on file   Number of children: Not on file   Years of education: Not on file   Highest education level: Not on file  Occupational History   Not on file  Tobacco Use   Smoking status: Former   Smokeless tobacco: Never   Tobacco comments:    many years ago.  Substance and Sexual Activity   Alcohol use: Yes    Alcohol/week: 50.0 standard drinks    Types: 50 Cans of beer per week   Drug use: Yes    Frequency: 7.0 times per week    Types: Marijuana   Sexual activity: Not on file  Other Topics Concern   Not on file  Social History Narrative   Not on file   Social Determinants of Health   Financial Resource Strain: Low Risk    Difficulty of Paying Living Expenses: Not hard at all  Food Insecurity: No Food Insecurity    Worried About Charity fundraiser in the Last Year: Never true   Ran Out of Food in the Last Year: Never true  Transportation Needs: No Transportation Needs   Lack of Transportation (Medical): No   Lack of Transportation (Non-Medical): No  Physical Activity: Sufficiently Active   Days of Exercise per Week: 4 days   Minutes of Exercise per Session: 60 min  Stress: No Stress Concern Present   Feeling of Stress : Not at all  Social Connections: Socially Isolated   Frequency of Communication with Friends and Family: More than three times a week   Frequency of Social Gatherings with Friends and Family: Twice a week   Attends Religious Services: Never   Marine scientist or Organizations: No   Attends Music therapist: Never   Marital Status: Divorced  Human resources officer Violence: Not At Risk   Fear of Current or Ex-Partner: No   Emotionally Abused: No   Physically Abused: No   Sexually Abused: No    Outpatient Medications Prior to Visit  Medication Sig Dispense Refill   losartan (COZAAR) 50 MG  tablet Take one twice daily 180 tablet 1   No facility-administered medications prior to visit.    Allergies  Allergen Reactions   Thimerosal     ROS Review of Systems  Constitutional: Negative.   Eyes:  Negative for photophobia and visual disturbance.  Respiratory: Negative.    Cardiovascular: Negative.   Gastrointestinal: Negative.   Genitourinary: Negative.   Neurological:  Negative for headaches.  Psychiatric/Behavioral: Negative.       Objective:    Physical Exam Vitals and nursing note reviewed.  Constitutional:      General: He is not in acute distress.    Appearance: Normal appearance. He is normal weight. He is not ill-appearing, toxic-appearing or diaphoretic.  HENT:     Head: Normocephalic and atraumatic.     Right Ear: External ear normal.     Left Ear: External ear normal.  Eyes:     General: No scleral icterus.       Right eye: No  discharge.        Left eye: No discharge.     Extraocular Movements: Extraocular movements intact.     Conjunctiva/sclera: Conjunctivae normal.  Neck:     Vascular: No carotid bruit.  Cardiovascular:     Rate and Rhythm: Normal rate and regular rhythm.  Pulmonary:     Effort: Pulmonary effort is normal.     Breath sounds: Normal breath sounds.  Abdominal:     General: Bowel sounds are normal.  Musculoskeletal:     Cervical back: No rigidity or tenderness.     Right lower leg: No edema.     Left lower leg: No edema.  Lymphadenopathy:     Cervical: No cervical adenopathy.  Skin:    General: Skin is warm and dry.  Neurological:     Mental Status: He is alert and oriented to person, place, and time.  Psychiatric:        Mood and Affect: Mood normal.        Behavior: Behavior normal.    BP 138/78 (BP Location: Right Arm, Patient Position: Sitting, Cuff Size: Normal)   Pulse 61   Temp (!) 97.3 F (36.3 C) (Temporal)   Ht '5\' 10"'$  (1.778 m)   Wt 183 lb 12.8 oz (83.4 kg)   SpO2 98%   BMI 26.37 kg/m  Wt Readings from Last 3 Encounters:  02/09/21 183 lb 12.8 oz (83.4 kg)  11/09/20 199 lb (90.3 kg)  02/01/20 211 lb 3.2 oz (95.8 kg)     Health Maintenance Due  Topic Date Due   Zoster Vaccines- Shingrix (1 of 2) Never done   PNA vac Low Risk Adult (1 of 2 - PCV13) Never done   COVID-19 Vaccine (5 - Booster for Pfizer series) 01/08/2021   INFLUENZA VACCINE  01/29/2021    There are no preventive care reminders to display for this patient.  No results found for: TSH Lab Results  Component Value Date   WBC 5.3 11/09/2020   HGB 14.5 11/09/2020   HCT 42.7 11/09/2020   MCV 96.7 11/09/2020   PLT 206.0 11/09/2020   Lab Results  Component Value Date   NA 144 11/09/2020   K 4.3 11/09/2020   CO2 30 11/09/2020   GLUCOSE 92 11/09/2020   BUN 9 11/09/2020   CREATININE 0.82 11/09/2020   BILITOT 0.7 11/09/2020   ALKPHOS 59 11/09/2020   AST 16 11/09/2020   ALT 17 11/09/2020    PROT 6.8 11/09/2020   ALBUMIN 4.6 11/09/2020  CALCIUM 9.9 11/09/2020   GFR 91.33 11/09/2020   Lab Results  Component Value Date   CHOL 190 11/09/2020   Lab Results  Component Value Date   HDL 78.50 11/09/2020   Lab Results  Component Value Date   LDLCALC 101 (H) 11/09/2020   Lab Results  Component Value Date   TRIG 51.0 11/09/2020   Lab Results  Component Value Date   CHOLHDL 2 11/09/2020   Lab Results  Component Value Date   HGBA1C 5.6 11/09/2020      Assessment & Plan:   Problem List Items Addressed This Visit       Cardiovascular and Mediastinum   Essential hypertension   Relevant Medications   losartan (COZAAR) 50 MG tablet   Other Relevant Orders   Lipid panel     Other   Vitamin D deficiency - Primary   Relevant Orders   VITAMIN D 25 Hydroxy (Vit-D Deficiency, Fractures)    Meds ordered this encounter  Medications   losartan (COZAAR) 50 MG tablet    Sig: Take one at night prior to bedtime.    Dispense:  90 tablet    Refill:  3     Follow-up: Return in about 6 months (around 08/12/2021), or Losartan '50mg'$  at bedtime only. Check and record bps. Looking for 130s/80s,, for Keep up the great work!.  Advised patient to keep up the great work!  Libby Maw, MD

## 2021-02-16 ENCOUNTER — Encounter: Payer: Self-pay | Admitting: Family Medicine

## 2021-02-23 ENCOUNTER — Encounter: Payer: Self-pay | Admitting: Gastroenterology

## 2021-02-23 ENCOUNTER — Encounter: Payer: Self-pay | Admitting: Family Medicine

## 2021-03-19 ENCOUNTER — Other Ambulatory Visit: Payer: Self-pay

## 2021-03-19 ENCOUNTER — Encounter: Payer: Self-pay | Admitting: Family Medicine

## 2021-03-19 ENCOUNTER — Ambulatory Visit (AMBULATORY_SURGERY_CENTER): Payer: Self-pay | Admitting: *Deleted

## 2021-03-19 VITALS — Ht 70.0 in | Wt 180.0 lb

## 2021-03-19 DIAGNOSIS — Z86010 Personal history of colonic polyps: Secondary | ICD-10-CM

## 2021-03-19 MED ORDER — NA SULFATE-K SULFATE-MG SULF 17.5-3.13-1.6 GM/177ML PO SOLN: 1.0000 | ORAL | 0 refills | Status: DC
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
## 2021-04-03 ENCOUNTER — Encounter: Payer: Self-pay | Admitting: Gastroenterology

## 2021-04-03 ENCOUNTER — Ambulatory Visit (AMBULATORY_SURGERY_CENTER): Payer: Medicare Other | Admitting: Gastroenterology

## 2021-04-03 ENCOUNTER — Other Ambulatory Visit: Payer: Self-pay

## 2021-04-03 VITALS — BP 153/90 | HR 56 | Temp 98.6°F | Resp 11 | Ht 70.0 in | Wt 180.0 lb

## 2021-04-03 DIAGNOSIS — D124 Benign neoplasm of descending colon: Secondary | ICD-10-CM | POA: Diagnosis not present

## 2021-04-03 DIAGNOSIS — D122 Benign neoplasm of ascending colon: Secondary | ICD-10-CM

## 2021-04-03 DIAGNOSIS — Z86010 Personal history of colonic polyps: Secondary | ICD-10-CM

## 2021-04-03 DIAGNOSIS — K635 Polyp of colon: Secondary | ICD-10-CM | POA: Diagnosis not present

## 2021-04-03 DIAGNOSIS — D123 Benign neoplasm of transverse colon: Secondary | ICD-10-CM

## 2021-04-03 DIAGNOSIS — D125 Benign neoplasm of sigmoid colon: Secondary | ICD-10-CM | POA: Diagnosis not present

## 2021-04-03 MED ORDER — SODIUM CHLORIDE 0.9 % IV SOLN: 500.0000 mL | Freq: Once | INTRAVENOUS | Status: DC

## 2021-04-03 NOTE — Progress Notes (Signed)
Sedate, gd SR, tolerated procedure well, VSS, report to RN 

## 2021-04-03 NOTE — Progress Notes (Signed)
Pt's states no medical or surgical changes since previsit or office visit. 

## 2021-04-03 NOTE — Progress Notes (Signed)
Called to room to assist during endoscopic procedure.  Patient ID and intended procedure confirmed with present staff. Received instructions for my participation in the procedure from the performing physician.  

## 2021-04-03 NOTE — Op Note (Signed)
[facility] Patient Name: Kyle Crawford Procedure Date: 04/03/2021 8:27 AM MRN: [id number] Endoscopist: Justice Britain , MD Age: 65 Referring MD:  Date of Birth: 06-24-1956 Gender: Male Account #: [id number] Procedure:                Colonoscopy Indications:              Surveillance: Personal history of adenomatous                            polyps on last colonoscopy 5 years ago Medicines:                Monitored Anesthesia Care Procedure:                Pre-Anesthesia Assessment:                           - Prior to the procedure, a History and Physical                            was performed, and patient medications and                            allergies were reviewed. The patient's tolerance of                            previous anesthesia was also reviewed. The risks                            and benefits of the procedure and the sedation                            options and risks were discussed with the patient.                            All questions were answered, and informed consent                            was obtained. Prior Anticoagulants: The patient has                            taken no previous anticoagulant or antiplatelet                            agents. ASA Grade Assessment: II - A patient with                            mild systemic disease. After reviewing the risks                            and benefits, the patient was deemed in                            satisfactory condition to undergo the procedure.  After obtaining informed consent, the colonoscope                            was passed under direct vision. Throughout the                            procedure, the patient's blood pressure, pulse, and                            oxygen saturations were monitored continuously. The                            Olympus CF-HQ190L ([id number]) Colonoscope was                            introduced through the anus  and advanced to the the                            cecum, identified by appendiceal orifice and                            ileocecal valve. The colonoscopy was performed                            without difficulty. The patient tolerated the                            procedure. The quality of the bowel preparation was                            adequate. The ileocecal valve, appendiceal orifice,                            and rectum were photographed. Scope In: 8:37:07 AM Scope Out: 8:59:00 AM Scope Withdrawal Time: 0 hours 16 minutes 3 seconds  Total Procedure Duration: 0 hours 21 minutes 53 seconds  Findings:                 The digital rectal exam findings include                            hemorrhoids. Pertinent negatives include no                            palpable rectal lesions.                           The terminal ileum and ileocecal valve appeared                            normal.                           Four sessile polyps were found in the sigmoid colon                            (  1), descending colon (1), transverse colon (1),                            and ascending colon (1). The polyps were 2 to 6 mm                            in size. These polyps were removed with a cold                            snare. Resection and retrieval were complete.                           Multiple small-mouthed diverticula were found in                            the ascending colon.                           Normal mucosa was found in the entire colon                            otherwise.                           Non-bleeding non-thrombosed external and internal                            hemorrhoids were found during retroflexion, during                            perianal exam and during digital exam. The                            hemorrhoids were Grade II (internal hemorrhoids                            that prolapse but reduce spontaneously). Complications:            No  immediate complications. Estimated Blood Loss:     Estimated blood loss was minimal. Impression:               - Hemorrhoids found on digital rectal exam.                           - The examined portion of the ileum was normal.                           - Four 2 to 6 mm polyps in the sigmoid colon, in                            the descending colon, in the transverse colon and                            in the ascending colon, removed with a cold snare.  Resected and retrieved.                           - Diverticulosis in the ascending colon.                           - Normal mucosa in the entire examined colon                            otherwise.                           - Non-bleeding non-thrombosed external and internal                            hemorrhoids. Recommendation:           - The patient will be observed post-procedure,                            until all discharge criteria are met.                           - Discharge patient to home.                           - Patient has a contact number available for                            emergencies. The signs and symptoms of potential                            delayed complications were discussed with the                            patient. Return to normal activities tomorrow.                            Written discharge instructions were provided to the                            patient.                           - High fiber diet.                           - Use FiberCon 1-2 tablets PO daily.                           - Continue present medications.                           - Await pathology results.                           - Repeat colonoscopy in 3/5/7 years for  surveillance based on pathology results.                           - The findings and recommendations were discussed                            with the patient. Justice Britain, MD 04/03/2021  9:05:05 AM

## 2021-04-03 NOTE — Progress Notes (Signed)
GASTROENTEROLOGY PROCEDURE H&P NOTE   Primary Care Physician: Libby Maw, MD  HPI: Kyle Crawford is a 65 y.o. male who presents for Colonoscopy for screening/polyp surveillance.  Past Medical History:  Diagnosis Date   Arthritis    History of Barrett's esophagus    Hypertension    Past Surgical History:  Procedure Laterality Date   arm fracture surgery     COLONOSCOPY  2017   DENTAL SURGERY     EPIDIDYMIS SURGERY     INGUINAL HERNIA REPAIR     NERVE GRAFT     POLYPECTOMY     tendon repair foot/ankle     UPPER GASTROINTESTINAL ENDOSCOPY  1998   Current Outpatient Medications  Medication Sig Dispense Refill   Cholecalciferol (VITAMIN D3 PO) Take by mouth.     losartan (COZAAR) 50 MG tablet Take one at night prior to bedtime. 90 tablet 3   Current Facility-Administered Medications  Medication Dose Route Frequency Provider Last Rate Last Admin   0.9 %  sodium chloride infusion  500 mL Intravenous Once Mansouraty, Telford Nab., MD        Current Outpatient Medications:    Cholecalciferol (VITAMIN D3 PO), Take by mouth., Disp: , Rfl:    losartan (COZAAR) 50 MG tablet, Take one at night prior to bedtime., Disp: 90 tablet, Rfl: 3  Current Facility-Administered Medications:    0.9 %  sodium chloride infusion, 500 mL, Intravenous, Once, Mansouraty, Telford Nab., MD Allergies  Allergen Reactions   Thimerosal    Family History  Problem Relation Age of Onset   Alzheimer's disease Father    Bladder Cancer Father    Diabetes Sister    Diabetes Maternal Grandmother    Heart attack Maternal Grandfather    Colon cancer Neg Hx    Esophageal cancer Neg Hx    Stomach cancer Neg Hx    Rectal cancer Neg Hx    Social History   Socioeconomic History   Marital status: Single    Spouse name: Not on file   Number of children: Not on file   Years of education: Not on file   Highest education level: Not on file  Occupational History   Not on file  Tobacco Use    Smoking status: Former   Smokeless tobacco: Never   Tobacco comments:    many years ago.  Vaping Use   Vaping Use: Never used  Substance and Sexual Activity   Alcohol use: Not Currently   Drug use: Not Currently    Types: Marijuana   Sexual activity: Not on file  Other Topics Concern   Not on file  Social History Narrative   Not on file   Social Determinants of Health   Financial Resource Strain: Low Risk    Difficulty of Paying Living Expenses: Not hard at all  Food Insecurity: No Food Insecurity   Worried About Charity fundraiser in the Last Year: Never true   [place] in the Last Year: Never true  Transportation Needs: No Transportation Needs   Lack of Transportation (Medical): No   Lack of Transportation (Non-Medical): No  Physical Activity: Sufficiently Active   Days of Exercise per Week: 4 days   Minutes of Exercise per Session: 60 min  Stress: No Stress Concern Present   Feeling of Stress : Not at all  Social Connections: Socially Isolated   Frequency of Communication with Friends and Family: More than three times a week   Frequency of  Social Gatherings with Friends and Family: Twice a week   Attends Religious Services: Never   Marine scientist or Organizations: No   Attends Music therapist: Never   Marital Status: Divorced  Human resources officer Violence: Not At Risk   Fear of Current or Ex-Partner: No   Emotionally Abused: No   Physically Abused: No   Sexually Abused: No    Physical Exam: Today's Vitals   04/03/21 0736 04/03/21 0742  BP: (!) 144/79   Pulse: (!) 55   Temp: 98.6 F (37 C) 98.6 F (37 C)  SpO2: 97%   Weight: 180 lb (81.6 kg)   Height: 5\' 10"  (1.778 m)    Body mass index is 25.83 kg/m. GEN: NAD EYE: Sclerae anicteric ENT: MMM CV: Non-tachycardic GI: Soft, NT/ND NEURO:  Alert & Oriented x 3  Lab Results: No results for input(s): WBC, HGB, HCT, PLT in the last 72 hours. BMET No results for input(s): NA, K,  CL, CO2, GLUCOSE, BUN, CREATININE, CALCIUM in the last 72 hours. LFT No results for input(s): PROT, ALBUMIN, AST, ALT, ALKPHOS, BILITOT, BILIDIR, IBILI in the last 72 hours. PT/INR No results for input(s): LABPROT, INR in the last 72 hours.   Impression / Plan: This is a 65 y.o.male who presents for Colonoscopy for screening/polyp surveillance.  The risks and benefits of endoscopic evaluation/treatment were discussed with the patient and/or family; these include but are not limited to the risk of perforation, infection, bleeding, missed lesions, lack of diagnosis, severe illness requiring hospitalization, as well as anesthesia and sedation related illnesses.  The patient's history has been reviewed, patient examined, no change in status, and deemed stable for procedure.  The patient and/or family is agreeable to proceed.    Justice Britain, MD [facility] Office # [phone]

## 2021-04-03 NOTE — Patient Instructions (Signed)
Handouts given for polyps, diverticulosis, hemorrhoids and high fiber diet. Use FiberCon 1-2 tablets daily and drink plenty of water. Await pathology results.  Next colonoscopy date will be determined after that. Resume previous medications and diet.   YOU HAD AN ENDOSCOPIC PROCEDURE TODAY AT [facility]:   Refer to the procedure report that was given to you for any specific questions about what was found during the examination.  If the procedure report does not answer your questions, please call your gastroenterologist to clarify.  If you requested that your care partner not be given the details of your procedure findings, then the procedure report has been included in a sealed envelope for you to review at your convenience later.  YOU SHOULD EXPECT: Some feelings of bloating in the abdomen. Passage of more gas than usual.  Walking can help get rid of the air that was put into your GI tract during the procedure and reduce the bloating. If you had a lower endoscopy (such as a colonoscopy or flexible sigmoidoscopy) you may notice spotting of blood in your stool or on the toilet paper. If you underwent a bowel prep for your procedure, you may not have a normal bowel movement for a few days.  Please Note:  You might notice some irritation and congestion in your nose or some drainage.  This is from the oxygen used during your procedure.  There is no need for concern and it should clear up in a day or so.  SYMPTOMS TO REPORT IMMEDIATELY:  Following lower endoscopy (colonoscopy or flexible sigmoidoscopy):  Excessive amounts of blood in the stool  Significant tenderness or worsening of abdominal pains  Swelling of the abdomen that is new, acute  Fever of 100F or higher  For urgent or emergent issues, a gastroenterologist can be reached at any hour by calling [phone]. Do not use MyChart messaging for urgent concerns.    DIET:  We do recommend a small meal at first, but then  you may proceed to your regular diet.  Drink plenty of fluids but you should avoid alcoholic beverages for 24 hours.  ACTIVITY:  You should plan to take it easy for the rest of today and you should NOT DRIVE or use heavy machinery until tomorrow (because of the sedation medicines used during the test).    FOLLOW UP: Our staff will call the number listed on your records 48-72 hours following your procedure to check on you and address any questions or concerns that you may have regarding the information given to you following your procedure. If we do not reach you, we will leave a message.  We will attempt to reach you two times.  During this call, we will ask if you have developed any symptoms of COVID 19. If you develop any symptoms (ie: fever, flu-like symptoms, shortness of breath, cough etc.) before then, please call [phone].  If you test positive for Covid 19 in the 2 weeks post procedure, please call and report this information to [place].    If any biopsies were taken you will be contacted by phone or by letter within the next 1-3 weeks.  Please call us at [phone] if you have not heard about the biopsies in 3 weeks.    SIGNATURES/CONFIDENTIALITY: You and/or your care partner have signed paperwork which will be entered into your electronic medical record.  These signatures attest to the fact that that the information above on your After Visit Summary has been reviewed  and is understood.  Full responsibility of the confidentiality of this discharge information lies with you and/or your care-partner.

## 2021-04-05 ENCOUNTER — Telehealth: Payer: Self-pay

## 2021-04-05 NOTE — Telephone Encounter (Signed)
  Follow up Call-  Call back number 04/03/2021  Post procedure Call Back phone  # [phone]  Permission to leave phone message Yes  Some recent data might be hidden     Patient questions:  Do you have a fever, pain , or abdominal swelling? No. Pain Score  0 *  Have you tolerated food without any problems? Yes.    Have you been able to return to your normal activities? Yes.    Do you have any questions about your discharge instructions: Diet   No. Medications  No. Follow up visit  No.  Do you have questions or concerns about your Care? No.  Actions: * If pain score is 4 or above: No action needed, pain <4.

## 2021-04-06 IMAGING — DX DG FOOT COMPLETE 3+V*R*
3 series · 3 of 3 positions shown · non-contrast
Comparison: 11/19/2017

CLINICAL DATA: Possible heel foreign body

EXAM:
RIGHT FOOT COMPLETE - 3+ VIEW

[foot ap]
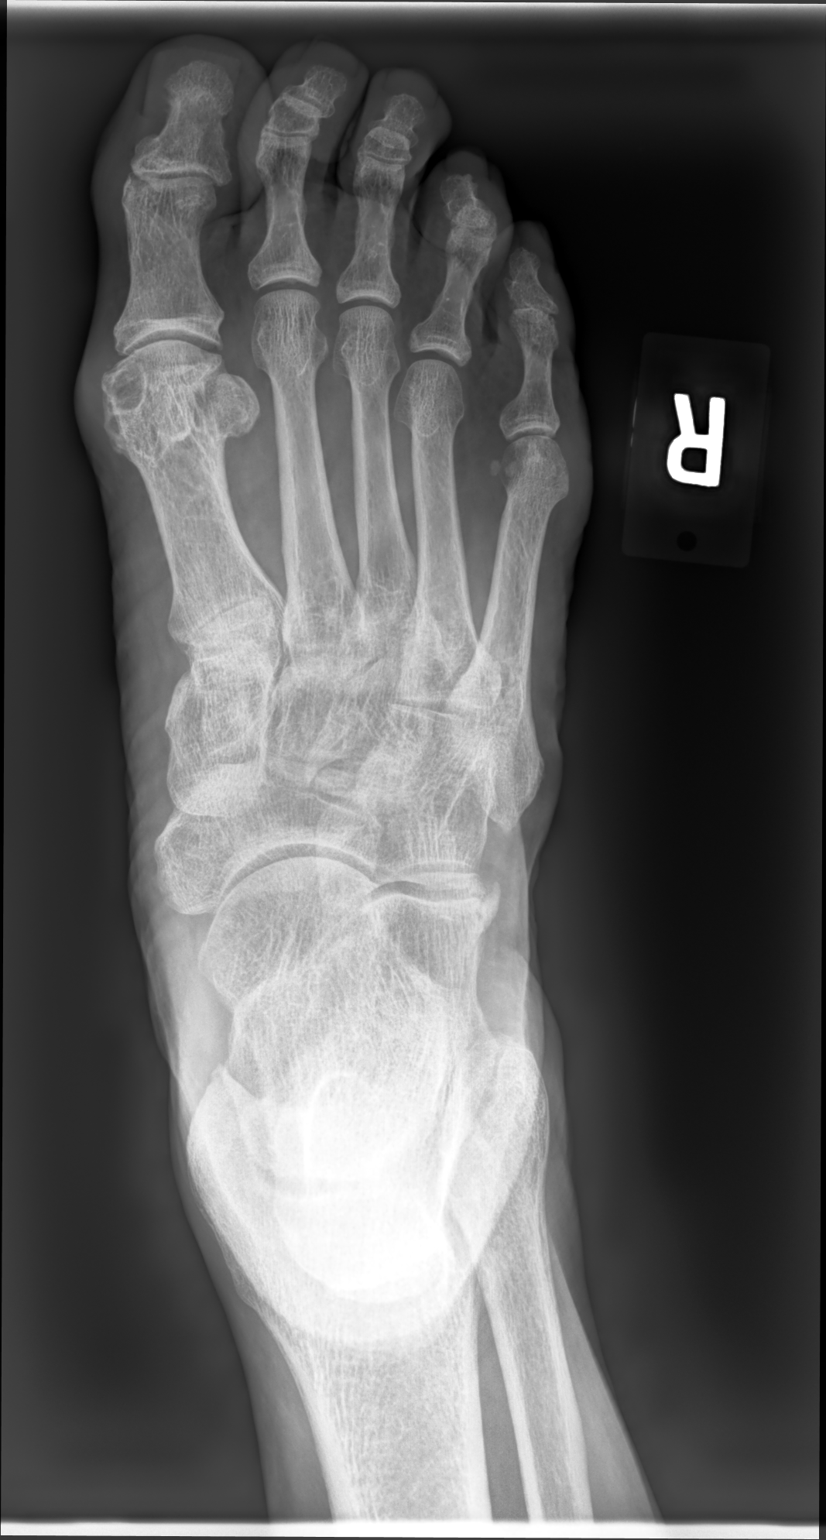

[foot mlo]
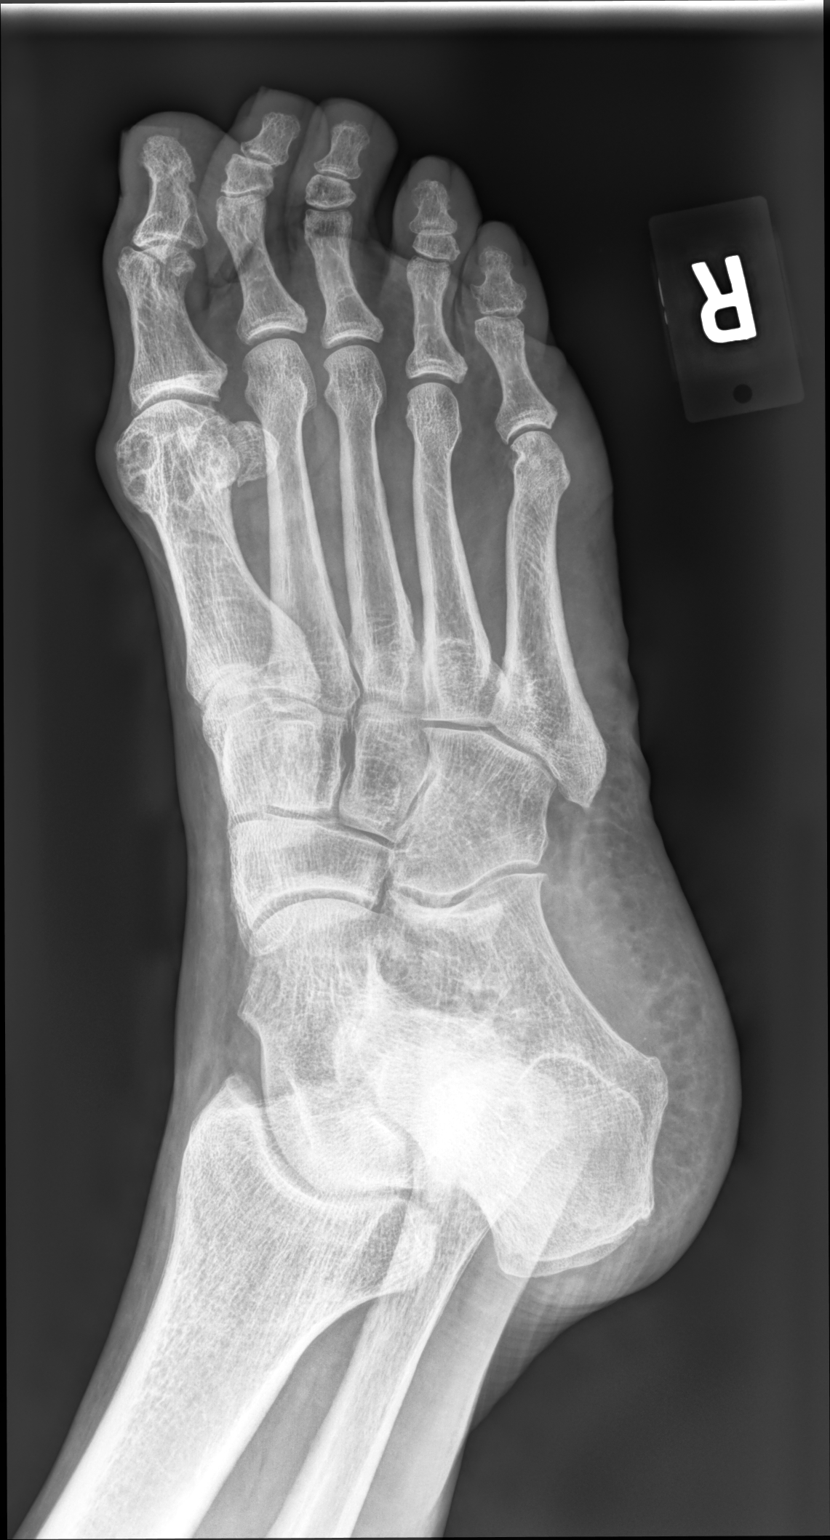

[foot lat]
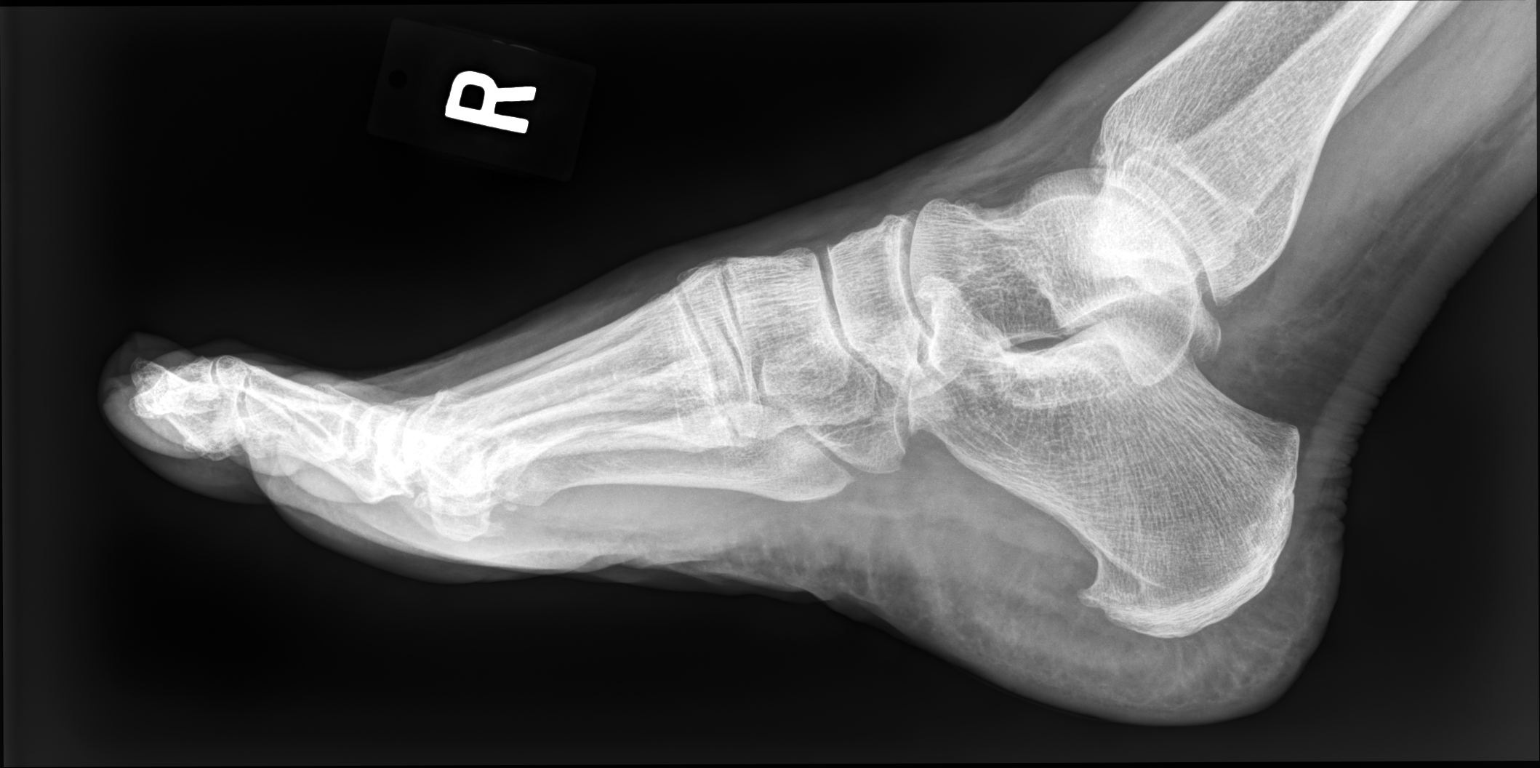

[3 of 3 positions shown; findings below may reference images not displayed]

FINDINGS: No acute fracture or dislocation. Mild-to-moderate arthropathy of
the first MTP joint. Small plantar calcaneal spur. No radiopaque
foreign body seen within the plantar soft tissues.
IMPRESSION: No radiopaque foreign body seen within the plantar soft tissues.

## 2021-04-07 ENCOUNTER — Encounter: Payer: Self-pay | Admitting: Gastroenterology

## 2021-04-08 ENCOUNTER — Encounter: Payer: Self-pay | Admitting: Family Medicine

## 2021-04-09 ENCOUNTER — Other Ambulatory Visit: Payer: Self-pay

## 2021-04-09 DIAGNOSIS — L570 Actinic keratosis: Secondary | ICD-10-CM

## 2021-07-12 DIAGNOSIS — E663 Overweight: Secondary | ICD-10-CM | POA: Diagnosis not present

## 2021-07-12 DIAGNOSIS — I10 Essential (primary) hypertension: Secondary | ICD-10-CM | POA: Diagnosis not present

## 2021-08-10 ENCOUNTER — Encounter: Payer: Self-pay | Admitting: Family Medicine

## 2021-08-13 ENCOUNTER — Encounter: Payer: Self-pay | Admitting: Family Medicine

## 2021-08-13 ENCOUNTER — Ambulatory Visit (INDEPENDENT_AMBULATORY_CARE_PROVIDER_SITE_OTHER): Payer: PPO | Admitting: Family Medicine

## 2021-08-13 ENCOUNTER — Other Ambulatory Visit: Payer: Self-pay

## 2021-08-13 VITALS — BP 135/84 | HR 62 | Temp 96.3°F | Resp 18 | Ht 70.5 in | Wt 175.4 lb

## 2021-08-13 DIAGNOSIS — I10 Essential (primary) hypertension: Secondary | ICD-10-CM | POA: Diagnosis not present

## 2021-08-13 DIAGNOSIS — E559 Vitamin D deficiency, unspecified: Secondary | ICD-10-CM | POA: Diagnosis not present

## 2021-08-13 LAB — VITAMIN D 25 HYDROXY (VIT D DEFICIENCY, FRACTURES): VITD: 27.41 ng/mL — ABNORMAL LOW (ref 30.00–100.00)

## 2021-08-13 NOTE — Progress Notes (Addendum)
Established Patient Office Visit  Subjective:  Patient ID: Kyle Crawford, male    DOB: Apr 03, 1956  Age: 66 y.o. MRN: [id number]  CC:  Chief Complaint  Patient presents with   Follow-up    6 mo follow up HTN.    HPI Kyle Crawford presents for follow-up of hypertension and vitamin D deficiency.  Doing well.  Doing multiple stretches and finding relief of shoulder and lower back pain.  Currently working with a physical therapist.  Continues losartan without issue.  Continues vitamin D supplementation.  Has not been walking as much as he would like.  He has an older dog who is blind.  97 year old mother fell and broke her hip.  Things are not looking good for her.  Past Medical History:  Diagnosis Date   Arthritis    History of Barrett's esophagus    Hypertension     Past Surgical History:  Procedure Laterality Date   arm fracture surgery     COLONOSCOPY  2017   DENTAL SURGERY     EPIDIDYMIS SURGERY     INGUINAL HERNIA REPAIR     NERVE GRAFT     POLYPECTOMY     tendon repair foot/ankle     UPPER GASTROINTESTINAL ENDOSCOPY  1998    Family History  Problem Relation Age of Onset   Alzheimer's disease Father    Bladder Cancer Father    Diabetes Sister    Diabetes Maternal Grandmother    Heart attack Maternal Grandfather    Colon cancer Neg Hx    Esophageal cancer Neg Hx    Stomach cancer Neg Hx    Rectal cancer Neg Hx     Social History   Socioeconomic History   Marital status: Single    Spouse name: Not on file   Number of children: Not on file   Years of education: Not on file   Highest education level: Not on file  Occupational History   Not on file  Tobacco Use   Smoking status: Former   Smokeless tobacco: Never   Tobacco comments:    many years ago.  Vaping Use   Vaping Use: Never used  Substance and Sexual Activity   Alcohol use: Not Currently   Drug use: Not Currently    Types: Marijuana   Sexual activity: Not on file  Other Topics Concern    Not on file  Social History Narrative   Not on file   Social Determinants of Health   Financial Resource Strain: Low Risk    Difficulty of Paying Living Expenses: Not hard at all  Food Insecurity: No Food Insecurity   Worried About Charity fundraiser in the Last Year: Never true   [place] in the Last Year: Never true  Transportation Needs: No Transportation Needs   Lack of Transportation (Medical): No   Lack of Transportation (Non-Medical): No  Physical Activity: Sufficiently Active   Days of Exercise per Week: 4 days   Minutes of Exercise per Session: 60 min  Stress: No Stress Concern Present   Feeling of Stress : Not at all  Social Connections: Socially Isolated   Frequency of Communication with Friends and Family: More than three times a week   Frequency of Social Gatherings with Friends and Family: Twice a week   Attends Religious Services: Never   Marine scientist or Organizations: No   Attends Archivist Meetings: Never   Marital Status: Divorced  Human resources officer  Violence: Not At Risk   Fear of Current or Ex-Partner: No   Emotionally Abused: No   Physically Abused: No   Sexually Abused: No    Outpatient Medications Prior to Visit  Medication Sig Dispense Refill   Fish Oil-Cholecalciferol (FISH OIL + D3) 1000-1000 MG-UNIT CAPS Take by mouth.     losartan (COZAAR) 50 MG tablet Take one at night prior to bedtime. 90 tablet 3   Cholecalciferol (VITAMIN D3 PO) Take by mouth.     No facility-administered medications prior to visit.    Allergies  Allergen Reactions   Thimerosal     ROS Review of Systems  Constitutional: Negative.   HENT: Negative.    Respiratory: Negative.    Cardiovascular: Negative.   Gastrointestinal: Negative.   Endocrine: Negative for polyphagia and polyuria.  Genitourinary: Negative.   Musculoskeletal:  Negative for gait problem and joint swelling.  Neurological:  Negative for speech difficulty, weakness and  light-headedness.  Psychiatric/Behavioral: Negative.       Objective:    Physical Exam Vitals and nursing note reviewed.  Constitutional:      General: He is not in acute distress.    Appearance: Normal appearance. He is normal weight. He is not ill-appearing, toxic-appearing or diaphoretic.  HENT:     Head: Normocephalic and atraumatic.     Right Ear: External ear normal.     Left Ear: External ear normal.     Mouth/Throat:     Mouth: Mucous membranes are moist.     Pharynx: Oropharynx is clear. No oropharyngeal exudate or posterior oropharyngeal erythema.  Eyes:     General: No scleral icterus.       Right eye: No discharge.        Left eye: No discharge.     Extraocular Movements: Extraocular movements intact.     Conjunctiva/sclera: Conjunctivae normal.     Pupils: Pupils are equal, round, and reactive to light.  Cardiovascular:     Rate and Rhythm: Normal rate and regular rhythm.  Pulmonary:     Effort: Pulmonary effort is normal.     Breath sounds: Normal breath sounds.  Musculoskeletal:     Cervical back: No rigidity or tenderness.  Lymphadenopathy:     Cervical: No cervical adenopathy.  Skin:    General: Skin is warm and dry.  Neurological:     Mental Status: He is alert and oriented to person, place, and time.  Psychiatric:        Mood and Affect: Mood normal.        Behavior: Behavior normal.    BP 135/84 (BP Location: Left Arm, Patient Position: Sitting, Cuff Size: Normal)    Pulse 62    Temp (!) 96.3 F (35.7 C) (Temporal)    Resp 18    Ht 5' 10.5" (1.791 m)    Wt 175 lb 6.4 oz (79.6 kg)    SpO2 99%    BMI 24.81 kg/m  Wt Readings from Last 3 Encounters:  08/13/21 175 lb 6.4 oz (79.6 kg)  04/03/21 180 lb (81.6 kg)  03/19/21 180 lb (81.6 kg)     Health Maintenance Due  Topic Date Due   COVID-19 Vaccine (6 - Booster for Pfizer series) 05/13/2021    There are no preventive care reminders to display for this patient.  No results found for: TSH Lab  Results  Component Value Date   WBC 5.3 11/09/2020   HGB 14.5 11/09/2020   HCT 42.7 11/09/2020   MCV 96.7  11/09/2020   PLT 206.0 11/09/2020   Lab Results  Component Value Date   NA 144 11/09/2020   K 4.3 11/09/2020   CO2 30 11/09/2020   GLUCOSE 92 11/09/2020   BUN 9 11/09/2020   CREATININE 0.82 11/09/2020   BILITOT 0.7 11/09/2020   ALKPHOS 59 11/09/2020   AST 16 11/09/2020   ALT 17 11/09/2020   PROT 6.8 11/09/2020   ALBUMIN 4.6 11/09/2020   CALCIUM 9.9 11/09/2020   GFR 91.33 11/09/2020   Lab Results  Component Value Date   CHOL 224 (H) 02/09/2021   Lab Results  Component Value Date   HDL 94.40 02/09/2021   Lab Results  Component Value Date   LDLCALC 118 (H) 02/09/2021   Lab Results  Component Value Date   TRIG 60.0 02/09/2021   Lab Results  Component Value Date   CHOLHDL 2 02/09/2021   Lab Results  Component Value Date   HGBA1C 5.6 11/09/2020      Assessment & Plan:   Problem List Items Addressed This Visit       Cardiovascular and Mediastinum   Essential hypertension - Primary   Relevant Orders   Comprehensive metabolic panel     Other   Vitamin D deficiency   Relevant Medications   Vitamin D, Ergocalciferol, (DRISDOL) 1.25 MG (50000 UNIT) CAPS capsule   Other Relevant Orders   VITAMIN D 25 Hydroxy (Vit-D Deficiency, Fractures) (Completed)    Meds ordered this encounter  Medications   Vitamin D, Ergocalciferol, (DRISDOL) 1.25 MG (50000 UNIT) CAPS capsule    Sig: Take 1 capsule (50,000 Units total) by mouth every 7 (seven) days.    Dispense:  12 capsule    Refill:  3    Follow-up: Return in about 6 months (around 02/10/2022), or Continue Losartan and aerobic activity.Libby Maw, MD

## 2021-08-14 LAB — COMPREHENSIVE METABOLIC PANEL
ALT: 17 U/L (ref 0–53)
AST: 20 U/L (ref 0–37)
Albumin: 4.6 g/dL (ref 3.5–5.2)
Alkaline Phosphatase: 58 U/L (ref 39–117)
BUN: 9 mg/dL (ref 6–23)
CO2: 29 mEq/L (ref 19–32)
Calcium: 9.8 mg/dL (ref 8.4–10.5)
Chloride: 106 mEq/L (ref 96–112)
Creatinine, Ser: 0.76 mg/dL (ref 0.40–1.50)
GFR: 92.95 mL/min (ref >60.00)
Glucose, Bld: 94 mg/dL (ref 70–99)
Potassium: 4 mEq/L (ref 3.5–5.1)
Sodium: 142 mEq/L (ref 135–145)
Total Bilirubin: 0.5 mg/dL (ref 0.2–1.2)
Total Protein: 7 g/dL (ref 6.0–8.3)

## 2021-08-14 MED ORDER — VITAMIN D (ERGOCALCIFEROL) 1.25 MG (50000 UNIT) PO CAPS: 50000.0000 [IU] | ORAL_CAPSULE | ORAL | 3 refills | Status: DC

## 2021-08-14 NOTE — Addendum Note (Signed)
Addended by: Abelino Derrick A on: 08/14/2021 11:18 AM   Modules accepted: Orders

## 2021-09-10 ENCOUNTER — Encounter: Payer: Self-pay | Admitting: Family Medicine

## 2021-10-11 ENCOUNTER — Encounter: Payer: Self-pay | Admitting: Family Medicine

## 2021-10-11 NOTE — Telephone Encounter (Signed)
Please advise message below  °

## 2021-12-26 ENCOUNTER — Ambulatory Visit: Payer: PPO

## 2022-01-02 ENCOUNTER — Ambulatory Visit (INDEPENDENT_AMBULATORY_CARE_PROVIDER_SITE_OTHER): Payer: PPO

## 2022-01-02 DIAGNOSIS — Z0000 Encounter for general adult medical examination without abnormal findings: Secondary | ICD-10-CM

## 2022-01-02 NOTE — Progress Notes (Addendum)
I have personally reviewed this encounter including the documentation in this note and have collaborated with the care management provider regarding care management and care coordination activities to include development and update of the comprehensive care plan. I am certifying that I agree with the content of this note and encounter as supervising physician.    Subjective:   Kyle Crawford is a 66 y.o. male who presents for an Medicare Annual Wellness Visit.    I connected with Kyle Crawford today by telephone and verified that I am speaking with the correct person using two identifiers. Location patient: home Location provider: work Persons participating in the virtual visit: patient, provider.   I discussed the limitations, risks, security and privacy concerns of performing an evaluation and management service by telephone and the availability of in person appointments. I also discussed with the patient that there may be a patient responsible charge related to this service. The patient expressed understanding and verbally consented to this telephonic visit.    Interactive audio and video telecommunications were attempted between this provider and patient, however failed, due to patient having technical difficulties OR patient did not have access to video capability.  We continued and completed visit with audio only.    Review of Systems    Cardiac Risk Factors include: advanced age (>60mn, >>31women);male gender     Objective:    Today's Vitals   There is no height or weight on file to calculate BMI.     01/02/2022    8:23 AM 12/12/2020    9:12 AM  Advanced Directives  Does Patient Have a Medical Advance Directive? No No  Would patient like information on creating a medical advance directive? No - Patient declined No - Patient declined    Current Medications (verified) Outpatient Encounter Medications as of 01/02/2022  Medication Sig   losartan (COZAAR) 50 MG tablet Take one at  night prior to bedtime.   [DISCONTINUED] Fish Oil-Cholecalciferol (FISH OIL + D3) 1000-1000 MG-UNIT CAPS Take by mouth.   [DISCONTINUED] Vitamin D, Ergocalciferol, (DRISDOL) 1.25 MG (50000 UNIT) CAPS capsule Take 1 capsule (50,000 Units total) by mouth every 7 (seven) days.   No facility-administered encounter medications on file as of 01/02/2022.    Allergies (verified) Thimerosal   History: Past Medical History:  Diagnosis Date   Arthritis    History of Barrett's esophagus    Hypertension    Past Surgical History:  Procedure Laterality Date   arm fracture surgery     COLONOSCOPY  2017   DENTAL SURGERY     EPIDIDYMIS SURGERY     INGUINAL HERNIA REPAIR     NERVE GRAFT     POLYPECTOMY     tendon repair foot/ankle     UPPER GASTROINTESTINAL ENDOSCOPY  1998   Family History  Problem Relation Age of Onset   Alzheimer's disease Father    Bladder Cancer Father    Diabetes Sister    Diabetes Maternal Grandmother    Heart attack Maternal Grandfather    Colon cancer Neg Hx    Esophageal cancer Neg Hx    Stomach cancer Neg Hx    Rectal cancer Neg Hx    Social History   Socioeconomic History   Marital status: Single    Spouse name: Not on file   Number of children: Not on file   Years of education: Not on file   Highest education level: Not on file  Occupational History   Not on file  Tobacco  Use   Smoking status: Former   Smokeless tobacco: Never   Tobacco comments:    many years ago.  Vaping Use   Vaping Use: Never used  Substance and Sexual Activity   Alcohol use: Not Currently   Drug use: Not Currently    Types: Marijuana   Sexual activity: Not on file  Other Topics Concern   Not on file  Social History Narrative   Not on file   Social Determinants of Health   Financial Resource Strain: Low Risk  (01/02/2022)   Overall Financial Resource Strain (CARDIA)    Difficulty of Paying Living Expenses: Not hard at all  Food Insecurity: No Food Insecurity  (01/02/2022)   Hunger Vital Sign    Worried About Running Out of Food in the Last Year: Never true    Ran Out of Food in the Last Year: Never true  Transportation Needs: No Transportation Needs (01/02/2022)   PRAPARE - Hydrologist (Medical): No    Lack of Transportation (Non-Medical): No  Physical Activity: Sufficiently Active (01/02/2022)   Exercise Vital Sign    Days of Exercise per Week: 7 days    Minutes of Exercise per Session: 40 min  Stress: No Stress Concern Present (01/02/2022)   [place]    Feeling of Stress : Not at all  Social Connections: Socially Isolated (01/02/2022)   Social Connection and Isolation Panel [NHANES]    Frequency of Communication with Friends and Family: Three times a week    Frequency of Social Gatherings with Friends and Family: Three times a week    Attends Religious Services: Never    Active Member of Clubs or Organizations: No    Attends Archivist Meetings: Never    Marital Status: Divorced    Tobacco Counseling Counseling given: Not Answered Tobacco comments: many years ago.   Clinical Intake:  Pre-visit preparation completed: Yes  Pain : No/denies pain     Nutritional Risks: None Diabetes: No  How often do you need to have someone help you when you read instructions, pamphlets, or other written materials from your doctor or pharmacy?: 1 - Never What is the last grade level you completed in school?: BS  Diabetic?no   Interpreter Needed?: No  Information entered by :: [place]   Activities of Daily Living    01/02/2022    8:26 AM  In your present state of health, do you have any difficulty performing the following activities:  Hearing? 0  Vision? 0  Difficulty concentrating or making decisions? 0  Walking or climbing stairs? 0  Dressing or bathing? 0  Doing errands, shopping? 0  Preparing Food and eating ? N  Using the  Toilet? N  In the past six months, have you accidently leaked urine? N  Do you have problems with loss of bowel control? N  Managing your Medications? N  Managing your Finances? N  Housekeeping or managing your Housekeeping? N    Patient Care Team: Libby Maw, MD as PCP - General (Family Medicine)  Indicate any recent Medical Services you may have received from other than [facility] providers in the past year (date may be approximate).     Assessment:   This is a routine wellness examination for Kyle Crawford.  Hearing/Vision screen Vision Screening - Comments:: Declined   Dietary issues and exercise activities discussed: Current Exercise Habits: Home exercise routine, Type of exercise: walking, Time (Minutes): 60, Frequency (  Times/Week): 7, Weekly Exercise (Minutes/Week): 420, Intensity: Mild, Exercise limited by: None identified   Goals Addressed   None    Depression Screen    01/02/2022    8:24 AM 01/02/2022    8:22 AM 02/09/2021   11:01 AM 12/12/2020    9:37 AM 11/09/2020    3:32 PM 11/09/2020    2:27 PM  PHQ 2/9 Scores  PHQ - 2 Score 0 0 0 0 0 0  PHQ- 9 Score     0     Fall Risk    01/02/2022    8:24 AM 08/13/2021    9:56 AM 02/09/2021   11:01 AM 12/12/2020    9:26 AM 11/09/2020    2:28 PM  Fall Risk   Falls in the past year? 0 0 0 0 0  Number falls in past yr: 0 0  0   Injury with Fall? 0 0  0   Risk for fall due to : No Fall Risks      Follow up Falls evaluation completed   Falls evaluation completed;Falls prevention discussed     FALL RISK PREVENTION PERTAINING TO THE HOME:  Any stairs in or around the home? No  If so, are there any without handrails? No  Home free of loose throw rugs in walkways, pet beds, electrical cords, etc? Yes  Adequate lighting in your home to reduce risk of falls? Yes   ASSISTIVE DEVICES UTILIZED TO PREVENT FALLS:  Life alert? No  Use of a cane, walker or w/c? No  Grab bars in the bathroom? No  Shower chair or bench in shower? No   Elevated toilet seat or a handicapped toilet? No     Cognitive Function:    Normal cognitive status assessed by telephone conversation by this Nurse Health Advisor. No abnormalities found.  '    Immunizations Immunization History  Administered Date(s) Administered   Fluad Quad(high Dose 65+) 05/06/2019   Influenza-Unspecified 03/18/2021   PFIZER(Purple Top)SARS-COV-2 Vaccination 08/07/2019, 09/01/2019, 04/11/2020, 10/11/2020, 03/18/2021   Pfizer Covid-19 Vaccine Bivalent Booster 49yr & up 03/18/2021, 09/22/2021   Tdap 06/20/2010, 11/18/2017   Zoster Recombinat (Shingrix) 09/22/2021    TDAP status: Up to date  Flu Vaccine status: Up to date  Pneumococcal vaccine status: Up to date  Covid-19 vaccine status: Completed vaccines  Qualifies for Shingles Vaccine? Yes   Zostavax completed No   Shingrix Completed?: Yes  Screening Tests Health Maintenance  Topic Date Due   Zoster Vaccines- Shingrix (2 of 2) 11/17/2021   Pneumonia Vaccine 68 Years old (1 - PCV) 08/13/2022 (Originally 01/09/2019)   INFLUENZA VACCINE  01/29/2022   TETANUS/TDAP  11/19/2027   COLONOSCOPY (Pts 45-443yrInsurance coverage will need to be confirmed)  04/03/2028   COVID-19 Vaccine  Completed   Hepatitis C Screening  Completed   HPV VACCINES  Aged Out    Health Maintenance  Health Maintenance Due  Topic Date Due   Zoster Vaccines- Shingrix (2 of 2) 11/17/2021    Colorectal cancer screening: Type of screening: Colonoscopy. Completed 04/03/2021. Repeat every 7 years  Lung Cancer Screening: (Low Dose CT Chest recommended if Age 68-80ears, 30 pack-year currently smoking OR have quit w/in 15years.) does not qualify.   Lung Cancer Screening Referral: n/a  Additional Screening:  Hepatitis C Screening: does not qualify  Vision Screening: Recommended annual ophthalmology exams for early detection of glaucoma and other disorders of the eye. Is the patient up to date with their annual eye exam?  No  Who is the provider or what is the name of the office in which the patient attends annual eye exams? Declined  If pt is not established with a provider, would they like to be referred to a provider to establish care? No .   Dental Screening: Recommended annual dental exams for proper oral hygiene  Community Resource Referral / Chronic Care Management: CRR required this visit?  No   CCM required this visit?  No      Plan:     I have personally reviewed and noted the following in the patient's chart:   Medical and social history Use of alcohol, tobacco or illicit drugs  Current medications and supplements including opioid prescriptions. Patient is not currently taking opioid prescriptions. Functional ability and status Nutritional status Physical activity Advanced directives List of other physicians Hospitalizations, surgeries, and ER visits in previous 12 months Vitals Screenings to include cognitive, depression, and falls Referrals and appointments  In addition, I have reviewed and discussed with patient certain preventive protocols, quality metrics, and best practice recommendations. A written personalized care plan for preventive services as well as general preventive health recommendations were provided to patient.     Randel Pigg, LPN   07/04/863   Nurse Notes: none

## 2022-01-02 NOTE — Patient Instructions (Signed)
Mr. Kyle Crawford , Thank you for taking time to come for your Medicare Wellness Visit. I appreciate your ongoing commitment to your health goals. Please review the following plan we discussed and let me know if I can assist you in the future.   Screening recommendations/referrals: Colonoscopy: 04/03/2021 Recommended yearly ophthalmology/optometry visit for glaucoma screening and checkup Recommended yearly dental visit for hygiene and checkup  Vaccinations: Influenza vaccine: completed  Pneumococcal vaccine: completed  Tdap vaccine: 11/18/2017 Shingles vaccine: due 2nd dose     Advanced directives: none   Conditions/risks identified: none   Next appointment: none   Preventive Care 68 Years and Older, Male Preventive care refers to lifestyle choices and visits with your health care provider that can promote health and wellness. What does preventive care include? A yearly physical exam. This is also called an annual well check. Dental exams once or twice a year. Routine eye exams. Ask your health care provider how often you should have your eyes checked. Personal lifestyle choices, including: Daily care of your teeth and gums. Regular physical activity. Eating a healthy diet. Avoiding tobacco and drug use. Limiting alcohol use. Practicing safe sex. Taking low doses of aspirin every day. Taking vitamin and mineral supplements as recommended by your health care provider. What happens during an annual well check? The services and screenings done by your health care provider during your annual well check will depend on your age, overall health, lifestyle risk factors, and family history of disease. Counseling  Your health care provider may ask you questions about your: Alcohol use. Tobacco use. Drug use. Emotional well-being. Home and relationship well-being. Sexual activity. Eating habits. History of falls. Memory and ability to understand (cognition). Work and work  Statistician. Screening  You may have the following tests or measurements: Height, weight, and BMI. Blood pressure. Lipid and cholesterol levels. These may be checked every 5 years, or more frequently if you are over 3 years old. Skin check. Lung cancer screening. You may have this screening every year starting at age 34 if you have a 30-pack-year history of smoking and currently smoke or have quit within the past 15 years. Fecal occult blood test (FOBT) of the stool. You may have this test every year starting at age 8. Flexible sigmoidoscopy or colonoscopy. You may have a sigmoidoscopy every 5 years or a colonoscopy every 10 years starting at age 56. Prostate cancer screening. Recommendations will vary depending on your family history and other risks. Hepatitis C blood test. Hepatitis B blood test. Sexually transmitted disease (STD) testing. Diabetes screening. This is done by checking your blood sugar (glucose) after you have not eaten for a while (fasting). You may have this done every 1-3 years. Abdominal aortic aneurysm (AAA) screening. You may need this if you are a current or former smoker. Osteoporosis. You may be screened starting at age 69 if you are at high risk. Talk with your health care provider about your test results, treatment options, and if necessary, the need for more tests. Vaccines  Your health care provider may recommend certain vaccines, such as: Influenza vaccine. This is recommended every year. Tetanus, diphtheria, and acellular pertussis (Tdap, Td) vaccine. You may need a Td booster every 10 years. Zoster vaccine. You may need this after age 53. Pneumococcal 13-valent conjugate (PCV13) vaccine. One dose is recommended after age 90. Pneumococcal polysaccharide (PPSV23) vaccine. One dose is recommended after age 64. Talk to your health care provider about which screenings and vaccines you need and how often  you need them. This information is not intended to replace  advice given to you by your health care provider. Make sure you discuss any questions you have with your health care provider. Document Released: 07/14/2015 Document Revised: 03/06/2016 Document Reviewed: 04/18/2015 Elsevier Interactive Patient Education  2017 [place] Prevention in the Home Falls can cause injuries. They can happen to people of all ages. There are many things you can do to make your home safe and to help prevent falls. What can I do on the outside of my home? Regularly fix the edges of walkways and driveways and fix any cracks. Remove anything that might make you trip as you walk through a door, such as a raised step or threshold. Trim any bushes or trees on the path to your home. Use bright outdoor lighting. Clear any walking paths of anything that might make someone trip, such as rocks or tools. Regularly check to see if handrails are loose or broken. Make sure that both sides of any steps have handrails. Any raised decks and porches should have guardrails on the edges. Have any leaves, snow, or ice cleared regularly. Use sand or salt on walking paths during winter. Clean up any spills in your garage right away. This includes oil or grease spills. What can I do in the bathroom? Use night lights. Install grab bars by the toilet and in the tub and shower. Do not use towel bars as grab bars. Use non-skid mats or decals in the tub or shower. If you need to sit down in the shower, use a plastic, non-slip stool. Keep the floor dry. Clean up any water that spills on the floor as soon as it happens. Remove soap buildup in the tub or shower regularly. Attach bath mats securely with double-sided non-slip rug tape. Do not have throw rugs and other things on the floor that can make you trip. What can I do in the bedroom? Use night lights. Make sure that you have a light by your bed that is easy to reach. Do not use any sheets or blankets that are too big for your bed.  They should not hang down onto the floor. Have a firm chair that has side arms. You can use this for support while you get dressed. Do not have throw rugs and other things on the floor that can make you trip. What can I do in the kitchen? Clean up any spills right away. Avoid walking on wet floors. Keep items that you use a lot in easy-to-reach places. If you need to reach something above you, use a strong step stool that has a grab bar. Keep electrical cords out of the way. Do not use floor polish or wax that makes floors slippery. If you must use wax, use non-skid floor wax. Do not have throw rugs and other things on the floor that can make you trip. What can I do with my stairs? Do not leave any items on the stairs. Make sure that there are handrails on both sides of the stairs and use them. Fix handrails that are broken or loose. Make sure that handrails are as long as the stairways. Check any carpeting to make sure that it is firmly attached to the stairs. Fix any carpet that is loose or worn. Avoid having throw rugs at the top or bottom of the stairs. If you do have throw rugs, attach them to the floor with carpet tape. Make sure that you have a light  switch at the top of the stairs and the bottom of the stairs. If you do not have them, ask someone to add them for you. What else can I do to help prevent falls? Wear shoes that: Do not have high heels. Have rubber bottoms. Are comfortable and fit you well. Are closed at the toe. Do not wear sandals. If you use a stepladder: Make sure that it is fully opened. Do not climb a closed stepladder. Make sure that both sides of the stepladder are locked into place. Ask someone to hold it for you, if possible. Clearly mark and make sure that you can see: Any grab bars or handrails. First and last steps. Where the edge of each step is. Use tools that help you move around (mobility aids) if they are needed. These  include: Canes. Walkers. Scooters. Crutches. Turn on the lights when you go into a dark area. Replace any light bulbs as soon as they burn out. Set up your furniture so you have a clear path. Avoid moving your furniture around. If any of your floors are uneven, fix them. If there are any pets around you, be aware of where they are. Review your medicines with your doctor. Some medicines can make you feel dizzy. This can increase your chance of falling. Ask your doctor what other things that you can do to help prevent falls. This information is not intended to replace advice given to you by your health care provider. Make sure you discuss any questions you have with your health care provider. Document Released: 04/13/2009 Document Revised: 11/23/2015 Document Reviewed: 07/22/2014 Elsevier Interactive Patient Education  2017 [employer].

## 2022-02-12 ENCOUNTER — Ambulatory Visit (INDEPENDENT_AMBULATORY_CARE_PROVIDER_SITE_OTHER): Payer: PPO | Admitting: Family Medicine

## 2022-02-12 ENCOUNTER — Encounter: Payer: Self-pay | Admitting: Family Medicine

## 2022-02-12 VITALS — BP 136/80 | HR 61 | Temp 97.2°F | Ht 70.0 in | Wt 179.8 lb

## 2022-02-12 DIAGNOSIS — E7800 Pure hypercholesterolemia, unspecified: Secondary | ICD-10-CM | POA: Diagnosis not present

## 2022-02-12 DIAGNOSIS — Z0000 Encounter for general adult medical examination without abnormal findings: Secondary | ICD-10-CM

## 2022-02-12 DIAGNOSIS — E559 Vitamin D deficiency, unspecified: Secondary | ICD-10-CM | POA: Diagnosis not present

## 2022-02-12 DIAGNOSIS — Z125 Encounter for screening for malignant neoplasm of prostate: Secondary | ICD-10-CM

## 2022-02-12 DIAGNOSIS — S39012A Strain of muscle, fascia and tendon of lower back, initial encounter: Secondary | ICD-10-CM | POA: Diagnosis not present

## 2022-02-12 DIAGNOSIS — R030 Elevated blood-pressure reading, without diagnosis of hypertension: Secondary | ICD-10-CM | POA: Insufficient documentation

## 2022-02-12 LAB — LIPID PANEL
Cholesterol: 276 mg/dL — ABNORMAL HIGH (ref 0–200)
HDL: 118.3 mg/dL (ref >39.00)
LDL Cholesterol: 146 mg/dL — ABNORMAL HIGH (ref 0–99)
NonHDL: 157.68
Total CHOL/HDL Ratio: 2
Triglycerides: 60 mg/dL (ref 0.0–149.0)
VLDL: 12 mg/dL (ref 0.0–40.0)

## 2022-02-12 LAB — URINALYSIS, ROUTINE W REFLEX MICROSCOPIC
Bilirubin Urine: NEGATIVE
Ketones, ur: NEGATIVE
Leukocytes,Ua: NEGATIVE
Nitrite: NEGATIVE
RBC / HPF: NONE SEEN (ref 0–?)
Specific Gravity, Urine: <=1.005 — AB (ref 1.000–1.030)
Total Protein, Urine: NEGATIVE
Urine Glucose: NEGATIVE
Urobilinogen, UA: 0.2 (ref 0.0–1.0)
WBC, UA: NONE SEEN (ref 0–?)
pH: 7.5 (ref 5.0–8.0)

## 2022-02-12 LAB — COMPREHENSIVE METABOLIC PANEL
ALT: 16 U/L (ref 0–53)
AST: 17 U/L (ref 0–37)
Albumin: 4.8 g/dL (ref 3.5–5.2)
Alkaline Phosphatase: 63 U/L (ref 39–117)
BUN: 9 mg/dL (ref 6–23)
CO2: 29 mEq/L (ref 19–32)
Calcium: 10.1 mg/dL (ref 8.4–10.5)
Chloride: 104 mEq/L (ref 96–112)
Creatinine, Ser: 0.72 mg/dL (ref 0.40–1.50)
GFR: 94.15 mL/min (ref >60.00)
Glucose, Bld: 102 mg/dL — ABNORMAL HIGH (ref 70–99)
Potassium: 4.2 mEq/L (ref 3.5–5.1)
Sodium: 142 mEq/L (ref 135–145)
Total Bilirubin: 0.6 mg/dL (ref 0.2–1.2)
Total Protein: 7.1 g/dL (ref 6.0–8.3)

## 2022-02-12 LAB — CBC
HCT: 43.6 % (ref 39.0–52.0)
Hemoglobin: 14.4 g/dL (ref 13.0–17.0)
MCHC: 33.1 g/dL (ref 30.0–36.0)
MCV: 95.9 fl (ref 78.0–100.0)
Platelets: 237 10*3/uL (ref 150.0–400.0)
RBC: 4.54 Mil/uL (ref 4.22–5.81)
RDW: 14.2 % (ref 11.5–15.5)
WBC: 6.6 10*3/uL (ref 4.0–10.5)

## 2022-02-12 LAB — VITAMIN D 25 HYDROXY (VIT D DEFICIENCY, FRACTURES): VITD: 27.99 ng/mL — ABNORMAL LOW (ref 30.00–100.00)

## 2022-02-12 LAB — PSA: PSA: 1.98 ng/mL (ref 0.10–4.00)

## 2022-02-12 MED ORDER — METHOCARBAMOL 500 MG PO TABS: ORAL_TABLET | ORAL | 0 refills | Status: DC

## 2022-02-12 NOTE — Progress Notes (Signed)
Established Patient Office Visit  Subjective   Patient ID: Kyle Crawford, male    DOB: Aug 10, 1955  Age: 66 y.o. MRN: [id number]  Chief Complaint  Patient presents with   Annual Exam    CPE, no concerns. Patient fasting.     HPI follow-up of elevated blood pressure, elevated ldl cholesterol with favorable HDL vitamin D deficiency.  Patient is elderly dog passed.  He is 17 year old mother seems to be losing ground.  Pends was replaced but then she had another accident and broke her femur.  He is no longer taking the losartan.  He had noticed increased frequency of urination.  His blood pressures have been running in the 130/80 range of medicine.  He has morning blood sugars in the 150s to 160s.  He is taking a plan based on MND formula.  Injured his back doing calisthenics and has been experiencing some morning stiffness recently.  Continues aerobic exercise by walking.  Describes occasional urinary urgency and nocturia.  He is not fluid restricting.    Review of Systems  Constitutional: Negative.   HENT: Negative.    Eyes:  Negative for blurred vision, discharge and redness.  Respiratory: Negative.    Cardiovascular: Negative.   Gastrointestinal:  Negative for abdominal pain.  Genitourinary:  Positive for urgency. Negative for dysuria and frequency.  Musculoskeletal:  Positive for back pain and myalgias.  Skin:  Negative for rash.  Neurological:  Negative for tingling, loss of consciousness and weakness.  Endo/Heme/Allergies:  Negative for polydipsia.      Objective:     BP 136/80 (BP Location: Right Arm, Patient Position: Sitting, Cuff Size: Normal)   Pulse 61   Temp (!) 97.2 F (36.2 C) (Temporal)   Ht '5\' 10"'$  (1.778 m)   Wt 179 lb 12.8 oz (81.6 kg)   SpO2 97%   BMI 25.80 kg/m  BP Readings from Last 3 Encounters:  02/12/22 136/80  08/13/21 135/84  04/03/21 (!) 153/90   Wt Readings from Last 3 Encounters:  02/12/22 179 lb 12.8 oz (81.6 kg)  08/13/21 175 lb 6.4 oz  (79.6 kg)  04/03/21 180 lb (81.6 kg)      Physical Exam Constitutional:      General: He is not in acute distress.    Appearance: Normal appearance. He is not ill-appearing, toxic-appearing or diaphoretic.  HENT:     Head: Normocephalic and atraumatic.     Right Ear: External ear normal.     Left Ear: External ear normal.     Mouth/Throat:     Mouth: Mucous membranes are moist.     Pharynx: Oropharynx is clear. No oropharyngeal exudate or posterior oropharyngeal erythema.  Eyes:     General: No scleral icterus.       Right eye: No discharge.        Left eye: No discharge.     Extraocular Movements: Extraocular movements intact.     Conjunctiva/sclera: Conjunctivae normal.     Pupils: Pupils are equal, round, and reactive to light.  Cardiovascular:     Rate and Rhythm: Normal rate and regular rhythm.  Pulmonary:     Effort: Pulmonary effort is normal. No respiratory distress.     Breath sounds: Normal breath sounds.  Abdominal:     General: Bowel sounds are normal.     Tenderness: There is no guarding.  Musculoskeletal:     Cervical back: No rigidity or tenderness.     Lumbar back: No deformity or tenderness. Normal range  of motion.  Skin:    General: Skin is warm and dry.  Neurological:     Mental Status: He is alert and oriented to person, place, and time.  Psychiatric:        Mood and Affect: Mood normal.        Behavior: Behavior normal.      Results for orders placed or performed in visit on 02/12/22  CBC  Result Value Ref Range   WBC 6.6 4.0 - 10.5 K/uL   RBC 4.54 4.22 - 5.81 Mil/uL   Platelets 237.0 150.0 - 400.0 K/uL   Hemoglobin 14.4 13.0 - 17.0 g/dL   HCT 43.6 39.0 - 52.0 %   MCV 95.9 78.0 - 100.0 fl   MCHC 33.1 30.0 - 36.0 g/dL   RDW 14.2 11.5 - 15.5 %  Comprehensive metabolic panel  Result Value Ref Range   Sodium 142 135 - 145 mEq/L   Potassium 4.2 3.5 - 5.1 mEq/L   Chloride 104 96 - 112 mEq/L   CO2 29 19 - 32 mEq/L   Glucose, Bld 102 (H) 70 -  99 mg/dL   BUN 9 6 - 23 mg/dL   Creatinine, Ser 0.72 0.40 - 1.50 mg/dL   Total Bilirubin 0.6 0.2 - 1.2 mg/dL   Alkaline Phosphatase 63 39 - 117 U/L   AST 17 0 - 37 U/L   ALT 16 0 - 53 U/L   Total Protein 7.1 6.0 - 8.3 g/dL   Albumin 4.8 3.5 - 5.2 g/dL   GFR 94.15 >60.00 mL/min   Calcium 10.1 8.4 - 10.5 mg/dL  Lipid panel  Result Value Ref Range   Cholesterol 276 (H) 0 - 200 mg/dL   Triglycerides 60.0 0.0 - 149.0 mg/dL   HDL 118.30 >39.00 mg/dL   VLDL 12.0 0.0 - 40.0 mg/dL   LDL Cholesterol 146 (H) 0 - 99 mg/dL   Total CHOL/HDL Ratio 2    NonHDL 157.68   PSA  Result Value Ref Range   PSA 1.98 0.10 - 4.00 ng/mL  Urinalysis, Routine w reflex microscopic  Result Value Ref Range   Color, Urine YELLOW Yellow;Lt. Yellow;Straw;Dark Yellow;Amber;Green;Red;Brown   APPearance CLEAR Clear;Turbid;Slightly Cloudy;Cloudy   Specific Gravity, Urine <=1.005 (A) 1.000 - 1.030   pH 7.5 5.0 - 8.0   Total Protein, Urine NEGATIVE Negative   Urine Glucose NEGATIVE Negative   Ketones, ur NEGATIVE Negative   Bilirubin Urine NEGATIVE Negative   Hgb urine dipstick TRACE-INTACT (A) Negative   Urobilinogen, UA 0.2 0.0 - 1.0   Leukocytes,Ua NEGATIVE Negative   Nitrite NEGATIVE Negative   WBC, UA none seen 0-2/hpf   RBC / HPF none seen 0-2/hpf  VITAMIN D 25 Hydroxy (Vit-D Deficiency, Fractures)  Result Value Ref Range   VITD 27.99 (L) 30.00 - 100.00 ng/mL      The ASCVD Risk score (Arnett DK, et al., 2019) failed to calculate for the following reasons:   The valid HDL cholesterol range is 20 to 100 mg/dL    Assessment & Plan:   Problem List Items Addressed This Visit       Musculoskeletal and Integument   Strain of lumbar region   Relevant Medications   methocarbamol (ROBAXIN) 500 MG tablet     Other   Screening for prostate cancer   Relevant Orders   PSA (Completed)   Vitamin D deficiency - Primary   Relevant Medications   Vitamin D, Ergocalciferol, (DRISDOL) 1.25 MG (50000 UNIT)  CAPS capsule   Other  Relevant Orders   VITAMIN D 25 Hydroxy (Vit-D Deficiency, Fractures) (Completed)   Elevated BP without diagnosis of hypertension   Relevant Orders   CBC (Completed)   Comprehensive metabolic panel (Completed)   Urinalysis, Routine w reflex microscopic (Completed)   Other Visit Diagnoses     Elevated LDL cholesterol level       Relevant Orders   Comprehensive metabolic panel (Completed)   Lipid panel (Completed)       Return in about 3 months (around 05/15/2022).  We will check his blood pressures at random x2 or 3 times weekly.  Rechecking DNA level.  May need to switch back to vitamin D3.  Continue healthy lifestyle healthy nutrition and exercise.  Consider fluid restricting for a few hours prior to going to bed.  Libby Maw, MD

## 2022-02-13 ENCOUNTER — Encounter: Payer: Self-pay | Admitting: Family Medicine

## 2022-02-13 MED ORDER — VITAMIN D (ERGOCALCIFEROL) 1.25 MG (50000 UNIT) PO CAPS: 50000.0000 [IU] | ORAL_CAPSULE | ORAL | 3 refills | Status: DC

## 2022-02-13 NOTE — Addendum Note (Signed)
Addended by: Abelino Derrick A on: 02/13/2022 10:01 AM   Modules accepted: Orders

## 2023-01-06 ENCOUNTER — Ambulatory Visit (INDEPENDENT_AMBULATORY_CARE_PROVIDER_SITE_OTHER): Payer: PPO

## 2023-01-06 VITALS — Ht 70.0 in | Wt 167.4 lb

## 2023-01-06 DIAGNOSIS — Z0000 Encounter for general adult medical examination without abnormal findings: Secondary | ICD-10-CM

## 2023-01-06 NOTE — Progress Notes (Signed)
Subjective:   Kyle Crawford is a 67 y.o. male who presents for Medicare Annual/Subsequent preventive examination.  Visit Complete: Virtual  I connected with  Kyle Crawford on 01/06/23 by a audio enabled telemedicine application and verified that I am speaking with the correct person using two identifiers.  Patient Location: Home  Provider Location: Office/Clinic  I discussed the limitations of evaluation and management by telemedicine. The patient expressed understanding and agreed to proceed.  Patient Medicare AWV questionnaire was completed by the patient on 12/30/2022; I have confirmed that all information answered by patient is correct and no changes since this date.  Review of Systems     Cardiac Risk Factors include: advanced age (>61men, >63 women);hypertension;male gender     Objective:    Today's Vitals   01/06/23 0811  Weight: 167 lb 6.4 oz (75.9 kg)  Height: 5\' 10"  (1.778 m)   Body mass index is 24.02 kg/m.     01/06/2023    8:19 AM 01/02/2022    8:23 AM 12/12/2020    9:12 AM  Advanced Directives  Does Patient Have a Medical Advance Directive? No No No  Would patient like information on creating a medical advance directive?  No - Patient declined No - Patient declined    Current Medications (verified) Outpatient Encounter Medications as of 01/06/2023  Medication Sig   methocarbamol (ROBAXIN) 500 MG tablet May take 1 at night as needed.   Vitamin D, Ergocalciferol, (DRISDOL) 1.25 MG (50000 UNIT) CAPS capsule Take 1 capsule (50,000 Units total) by mouth every 7 (seven) days.   Vitamin D-Vitamin K (VITAMIN K2-VITAMIN D3 PO) Take 1 tablet by mouth daily. (Patient not taking: Reported on 01/06/2023)   No facility-administered encounter medications on file as of 01/06/2023.    Allergies (verified) Thimerosal (thiomersal)   History: Past Medical History:  Diagnosis Date   Arthritis    History of Barrett's esophagus    Hypertension    Past Surgical History:   Procedure Laterality Date   arm fracture surgery     COLONOSCOPY  2017   DENTAL SURGERY     EPIDIDYMIS SURGERY     INGUINAL HERNIA REPAIR     NERVE GRAFT     POLYPECTOMY     tendon repair foot/ankle     UPPER GASTROINTESTINAL ENDOSCOPY  1998   Family History  Problem Relation Age of Onset   Alzheimer's disease Father    Bladder Cancer Father    Diabetes Sister    Diabetes Maternal Grandmother    Heart attack Maternal Grandfather    Colon cancer Neg Hx    Esophageal cancer Neg Hx    Stomach cancer Neg Hx    Rectal cancer Neg Hx    Social History   Socioeconomic History   Marital status: Single    Spouse name: Not on file   Number of children: Not on file   Years of education: Not on file   Highest education level: Not on file  Occupational History   Not on file  Tobacco Use   Smoking status: Former   Smokeless tobacco: Never   Tobacco comments:    many years ago.  Vaping Use   Vaping Use: Never used  Substance and Sexual Activity   Alcohol use: Not Currently   Drug use: Not Currently    Types: Marijuana   Sexual activity: Not on file  Other Topics Concern   Not on file  Social History Narrative   Not on file  Social Determinants of Health   Financial Resource Strain: Low Risk  (12/30/2022)   Overall Financial Resource Strain (CARDIA)    Difficulty of Paying Living Expenses: Not hard at all  Food Insecurity: No Food Insecurity (12/30/2022)   Hunger Vital Sign    Worried About Running Out of Food in the Last Year: Never true    Ran Out of Food in the Last Year: Never true  Transportation Needs: No Transportation Needs (12/30/2022)   PRAPARE - Administrator, Civil Service (Medical): No    Lack of Transportation (Non-Medical): No  Physical Activity: Sufficiently Active (12/30/2022)   Exercise Vital Sign    Days of Exercise per Week: 7 days    Minutes of Exercise per Session: 120 min  Stress: Patient Declined (12/30/2022)   [employer] of  Occupational Health - Occupational Stress Questionnaire    Feeling of Stress : Patient declined  Social Connections: Unknown (12/30/2022)   Social Connection and Isolation Panel [NHANES]    Frequency of Communication with Friends and Family: Patient declined    Frequency of Social Gatherings with Friends and Family: Patient declined    Attends Religious Services: Not on Insurance claims handler of Clubs or Organizations: No    Attends Banker Meetings: Never    Marital Status: Patient declined    Tobacco Counseling Counseling given: Not Answered Tobacco comments: many years ago.   Clinical Intake:  Pre-visit preparation completed: Yes  Pain : No/denies pain     Nutritional Status: BMI of 19-24  Normal Nutritional Risks: None Diabetes: No  How often do you need to have someone help you when you read instructions, pamphlets, or other written materials from your doctor or pharmacy?: 1 - Never  Interpreter Needed?: No  Information entered by :: [name]   Activities of Daily Living    12/30/2022   10:29 AM  In your present state of health, do you have any difficulty performing the following activities:  Hearing? 0  Vision? 0  Difficulty concentrating or making decisions? 0  Walking or climbing stairs? 0  Dressing or bathing? 0  Doing errands, shopping? 0  Preparing Food and eating ? N  Using the Toilet? N  In the past six months, have you accidently leaked urine? N  Do you have problems with loss of bowel control? N  Managing your Medications? N  Managing your Finances? N  Housekeeping or managing your Housekeeping? N    Patient Care Team: Mliss Sax, MD as PCP - General (Family Medicine)  Indicate any recent Medical Services you may have received from other than [facility] providers in the past year (date may be approximate).     Assessment:   This is a routine wellness examination for Kyle Crawford.  Hearing/Vision screen Hearing Screening -  Comments:: Denies hearing issues Vision Screening - Comments:: Regular eye exams, [place]  Dietary issues and exercise activities discussed:     Goals Addressed             This Visit's Progress    Patient Stated       01/06/2023, wants to continue to maintain nerves       Depression Screen    01/06/2023    8:20 AM 02/12/2022    8:58 AM 01/02/2022    8:24 AM 01/02/2022    8:22 AM 02/09/2021   11:01 AM 12/12/2020    9:37 AM 11/09/2020    3:32 PM  PHQ 2/9 Scores  PHQ - 2 Score 0 0 0 0 0 0 0  PHQ- 9 Score 0      0    Fall Risk    12/30/2022   10:29 AM 02/12/2022    8:58 AM 01/02/2022    8:24 AM 08/13/2021    9:56 AM 02/09/2021   11:01 AM  Fall Risk   Falls in the past year? 0 0 0 0 0  Number falls in past yr: 0 0 0 0   Injury with Fall? 0  0 0   Risk for fall due to : No Fall Risks  No Fall Risks    Follow up Falls prevention discussed;Falls evaluation completed  Falls evaluation completed      MEDICARE RISK AT HOME:   TIMED UP AND GO:  Was the test performed?  No    Cognitive Function:        01/06/2023    8:21 AM  6CIT Screen  What Year? 0 points  What month? 0 points  What time? 0 points  Count back from 20 0 points  Months in reverse 0 points  Repeat phrase 0 points  Total Score 0 points    Immunizations Immunization History  Administered Date(s) Administered   Fluad Quad(high Dose 65+) 05/06/2019   Influenza-Unspecified 03/18/2021   PFIZER(Purple Top)SARS-COV-2 Vaccination 08/07/2019, 09/01/2019, 04/11/2020, 10/11/2020, 03/18/2021   Pfizer Covid-19 Vaccine Bivalent Booster 69yrs & up 03/18/2021, 09/22/2021   Tdap 06/20/2010, 11/18/2017   Zoster Recombinant(Shingrix) 09/22/2021    TDAP status: Up to date  Flu Vaccine status: Up to date  Pneumococcal vaccine status: Due, Education has been provided regarding the importance of this vaccine. Advised may receive this vaccine at local pharmacy or Health Dept. Aware to provide a copy of the vaccination  record if obtained from local pharmacy or Health Dept. Verbalized acceptance and understanding.  Covid-19 vaccine status: Information provided on how to obtain vaccines.   Qualifies for Shingles Vaccine? Yes   Zostavax completed Yes   Shingrix Completed?: needs second dose  Screening Tests Health Maintenance  Topic Date Due   Pneumonia Vaccine 64+ Years old (1 of 1 - PCV) Never done   Zoster Vaccines- Shingrix (2 of 2) 11/17/2021   Medicare Annual Wellness (AWV)  01/03/2023   INFLUENZA VACCINE  01/30/2023   DTaP/Tdap/Td (3 - Td or Tdap) 11/19/2027   Colonoscopy  04/03/2028   Hepatitis C Screening  Completed   HPV VACCINES  Aged Out   COVID-19 Vaccine  Discontinued    Health Maintenance  Health Maintenance Due  Topic Date Due   Pneumonia Vaccine 28+ Years old (1 of 1 - PCV) Never done   Zoster Vaccines- Shingrix (2 of 2) 11/17/2021   Medicare Annual Wellness (AWV)  01/03/2023    Colorectal cancer screening: Type of screening: Colonoscopy. Completed 04/03/2021. Repeat every 7 years  Lung Cancer Screening: (Low Dose CT Chest recommended if Age 32-80 years, 20 pack-year currently smoking OR have quit w/in 15years.) does not qualify.   Lung Cancer Screening Referral: no  Additional Screening:  Hepatitis C Screening: does qualify; Completed 05/11/2019  Vision Screening: Recommended annual ophthalmology exams for early detection of glaucoma and other disorders of the eye. Is the patient up to date with their annual eye exam?  Yes  Who is the provider or what is the name of the office in which the patient attends annual eye exams? [facility] If pt is not established with a provider, would they like to be referred to a  provider to establish care? No .   Dental Screening: Recommended annual dental exams for proper oral hygiene  Diabetic Foot Exam: n/a  Community Resource Referral / Chronic Care Management: CRR required this visit?  No   CCM required this visit?  No      Plan:     I have personally reviewed and noted the following in the patient's chart:   Medical and social history Use of alcohol, tobacco or illicit drugs  Current medications and supplements including opioid prescriptions. Patient is not currently taking opioid prescriptions. Functional ability and status Nutritional status Physical activity Advanced directives List of other physicians Hospitalizations, surgeries, and ER visits in previous 12 months Vitals Screenings to include cognitive, depression, and falls Referrals and appointments  In addition, I have reviewed and discussed with patient certain preventive protocols, quality metrics, and best practice recommendations. A written personalized care plan for preventive services as well as general preventive health recommendations were provided to patient.     Barb Merino, LPN   07/06/1094   After Visit Summary: (MyChart) Due to this being a telephonic visit, the after visit summary with patients personalized plan was offered to patient via MyChart   Nurse Notes: none

## 2023-01-06 NOTE — Patient Instructions (Signed)
Kyle Crawford , Thank you for taking time to come for your Medicare Wellness Visit. I appreciate your ongoing commitment to your health goals. Please review the following plan we discussed and let me know if I can assist you in the future.   These are the goals we discussed:  Goals      Patient Stated     Get bmi below 25 loose 12 pounds     Patient Stated     01/06/2023, wants to continue to maintain nerves        This is a list of the screening recommended for you and due dates:  Health Maintenance  Topic Date Due   Pneumonia Vaccine (1 of 1 - PCV) Never done   Zoster (Shingles) Vaccine (2 of 2) 11/17/2021   Medicare Annual Wellness Visit  01/03/2023   Flu Shot  01/30/2023   DTaP/Tdap/Td vaccine (3 - Td or Tdap) 11/19/2027   Colon Cancer Screening  04/03/2028   Hepatitis C Screening  Completed   HPV Vaccine  Aged Out   COVID-19 Vaccine  Discontinued    Advanced directives: Advance directive discussed with you today.   Conditions/risks identified: none  Next appointment: Follow up in one year for your annual wellness visit.   Preventive Care 39 Years and Older, Male  Preventive care refers to lifestyle choices and visits with your health care provider that can promote health and wellness. What does preventive care include? A yearly physical exam. This is also called an annual well check. Dental exams once or twice a year. Routine eye exams. Ask your health care provider how often you should have your eyes checked. Personal lifestyle choices, including: Daily care of your teeth and gums. Regular physical activity. Eating a healthy diet. Avoiding tobacco and drug use. Limiting alcohol use. Practicing safe sex. Taking low doses of aspirin every day. Taking vitamin and mineral supplements as recommended by your health care provider. What happens during an annual well check? The services and screenings done by your health care provider during your annual well check will  depend on your age, overall health, lifestyle risk factors, and family history of disease. Counseling  Your health care provider may ask you questions about your: Alcohol use. Tobacco use. Drug use. Emotional well-being. Home and relationship well-being. Sexual activity. Eating habits. History of falls. Memory and ability to understand (cognition). Work and work Astronomer. Screening  You may have the following tests or measurements: Height, weight, and BMI. Blood pressure. Lipid and cholesterol levels. These may be checked every 5 years, or more frequently if you are over 77 years old. Skin check. Lung cancer screening. You may have this screening every year starting at age 66 if you have a 30-pack-year history of smoking and currently smoke or have quit within the past 15 years. Fecal occult blood test (FOBT) of the stool. You may have this test every year starting at age 67. Flexible sigmoidoscopy or colonoscopy. You may have a sigmoidoscopy every 5 years or a colonoscopy every 10 years starting at age 58. Prostate cancer screening. Recommendations will vary depending on your family history and other risks. Hepatitis C blood test. Hepatitis B blood test. Sexually transmitted disease (STD) testing. Diabetes screening. This is done by checking your blood sugar (glucose) after you have not eaten for a while (fasting). You may have this done every 1-3 years. Abdominal aortic aneurysm (AAA) screening. You may need this if you are a current or former smoker. Osteoporosis. You may be  screened starting at age 24 if you are at high risk. Talk with your health care provider about your test results, treatment options, and if necessary, the need for more tests. Vaccines  Your health care provider may recommend certain vaccines, such as: Influenza vaccine. This is recommended every year. Tetanus, diphtheria, and acellular pertussis (Tdap, Td) vaccine. You may need a Td booster every 10  years. Zoster vaccine. You may need this after age 63. Pneumococcal 13-valent conjugate (PCV13) vaccine. One dose is recommended after age 5. Pneumococcal polysaccharide (PPSV23) vaccine. One dose is recommended after age 88. Talk to your health care provider about which screenings and vaccines you need and how often you need them. This information is not intended to replace advice given to you by your health care provider. Make sure you discuss any questions you have with your health care provider. Document Released: 07/14/2015 Document Revised: 03/06/2016 Document Reviewed: 04/18/2015 Elsevier Interactive Patient Education  2017 [employer].  Fall Prevention in the Home Falls can cause injuries. They can happen to people of all ages. There are many things you can do to make your home safe and to help prevent falls. What can I do on the outside of my home? Regularly fix the edges of walkways and driveways and fix any cracks. Remove anything that might make you trip as you walk through a door, such as a raised step or threshold. Trim any bushes or trees on the path to your home. Use bright outdoor lighting. Clear any walking paths of anything that might make someone trip, such as rocks or tools. Regularly check to see if handrails are loose or broken. Make sure that both sides of any steps have handrails. Any raised decks and porches should have guardrails on the edges. Have any leaves, snow, or ice cleared regularly. Use sand or salt on walking paths during winter. Clean up any spills in your garage right away. This includes oil or grease spills. What can I do in the bathroom? Use night lights. Install grab bars by the toilet and in the tub and shower. Do not use towel bars as grab bars. Use non-skid mats or decals in the tub or shower. If you need to sit down in the shower, use a plastic, non-slip stool. Keep the floor dry. Clean up any water that spills on the floor as soon as it  happens. Remove soap buildup in the tub or shower regularly. Attach bath mats securely with double-sided non-slip rug tape. Do not have throw rugs and other things on the floor that can make you trip. What can I do in the bedroom? Use night lights. Make sure that you have a light by your bed that is easy to reach. Do not use any sheets or blankets that are too big for your bed. They should not hang down onto the floor. Have a firm chair that has side arms. You can use this for support while you get dressed. Do not have throw rugs and other things on the floor that can make you trip. What can I do in the kitchen? Clean up any spills right away. Avoid walking on wet floors. Keep items that you use a lot in easy-to-reach places. If you need to reach something above you, use a strong step stool that has a grab bar. Keep electrical cords out of the way. Do not use floor polish or wax that makes floors slippery. If you must use wax, use non-skid floor wax. Do not have  throw rugs and other things on the floor that can make you trip. What can I do with my stairs? Do not leave any items on the stairs. Make sure that there are handrails on both sides of the stairs and use them. Fix handrails that are broken or loose. Make sure that handrails are as long as the stairways. Check any carpeting to make sure that it is firmly attached to the stairs. Fix any carpet that is loose or worn. Avoid having throw rugs at the top or bottom of the stairs. If you do have throw rugs, attach them to the floor with carpet tape. Make sure that you have a light switch at the top of the stairs and the bottom of the stairs. If you do not have them, ask someone to add them for you. What else can I do to help prevent falls? Wear shoes that: Do not have high heels. Have rubber bottoms. Are comfortable and fit you well. Are closed at the toe. Do not wear sandals. If you use a stepladder: Make sure that it is fully opened.  Do not climb a closed stepladder. Make sure that both sides of the stepladder are locked into place. Ask someone to hold it for you, if possible. Clearly mark and make sure that you can see: Any grab bars or handrails. First and last steps. Where the edge of each step is. Use tools that help you move around (mobility aids) if they are needed. These include: Canes. Walkers. Scooters. Crutches. Turn on the lights when you go into a dark area. Replace any light bulbs as soon as they burn out. Set up your furniture so you have a clear path. Avoid moving your furniture around. If any of your floors are uneven, fix them. If there are any pets around you, be aware of where they are. Review your medicines with your doctor. Some medicines can make you feel dizzy. This can increase your chance of falling. Ask your doctor what other things that you can do to help prevent falls. This information is not intended to replace advice given to you by your health care provider. Make sure you discuss any questions you have with your health care provider. Document Released: 04/13/2009 Document Revised: 11/23/2015 Document Reviewed: 07/22/2014 Elsevier Interactive Patient Education  2017 [employer].

## 2023-01-29 ENCOUNTER — Encounter (INDEPENDENT_AMBULATORY_CARE_PROVIDER_SITE_OTHER): Payer: Self-pay

## 2023-02-13 ENCOUNTER — Encounter (INDEPENDENT_AMBULATORY_CARE_PROVIDER_SITE_OTHER): Payer: Self-pay

## 2023-02-14 ENCOUNTER — Encounter: Payer: PPO | Admitting: Family Medicine

## 2023-03-18 ENCOUNTER — Encounter: Payer: Self-pay | Admitting: Family Medicine

## 2023-03-18 ENCOUNTER — Ambulatory Visit (INDEPENDENT_AMBULATORY_CARE_PROVIDER_SITE_OTHER): Payer: PPO | Admitting: Family Medicine

## 2023-03-18 VITALS — BP 128/78 | HR 70 | Temp 97.9°F | Ht 70.0 in | Wt 167.0 lb

## 2023-03-18 DIAGNOSIS — E559 Vitamin D deficiency, unspecified: Secondary | ICD-10-CM

## 2023-03-18 DIAGNOSIS — Z131 Encounter for screening for diabetes mellitus: Secondary | ICD-10-CM

## 2023-03-18 DIAGNOSIS — Z125 Encounter for screening for malignant neoplasm of prostate: Secondary | ICD-10-CM

## 2023-03-18 DIAGNOSIS — Z0000 Encounter for general adult medical examination without abnormal findings: Secondary | ICD-10-CM | POA: Diagnosis not present

## 2023-03-18 DIAGNOSIS — E7800 Pure hypercholesterolemia, unspecified: Secondary | ICD-10-CM | POA: Diagnosis not present

## 2023-03-18 DIAGNOSIS — Z23 Encounter for immunization: Secondary | ICD-10-CM

## 2023-03-18 LAB — VITAMIN D 25 HYDROXY (VIT D DEFICIENCY, FRACTURES): VITD: 31.36 ng/mL (ref 30.00–100.00)

## 2023-03-18 LAB — COMPREHENSIVE METABOLIC PANEL
ALT: 19 U/L (ref 0–53)
AST: 21 U/L (ref 0–37)
Albumin: 4.4 g/dL (ref 3.5–5.2)
Alkaline Phosphatase: 71 U/L (ref 39–117)
BUN: 15 mg/dL (ref 6–23)
CO2: 28 meq/L (ref 19–32)
Calcium: 9.7 mg/dL (ref 8.4–10.5)
Chloride: 106 meq/L (ref 96–112)
Creatinine, Ser: 0.88 mg/dL (ref 0.40–1.50)
GFR: 87.94 mL/min (ref >60.00)
Glucose, Bld: 93 mg/dL (ref 70–99)
Potassium: 4.6 meq/L (ref 3.5–5.1)
Sodium: 140 meq/L (ref 135–145)
Total Bilirubin: 0.7 mg/dL (ref 0.2–1.2)
Total Protein: 6.6 g/dL (ref 6.0–8.3)

## 2023-03-18 LAB — LIPID PANEL
Cholesterol: 192 mg/dL (ref 0–200)
HDL: 106.4 mg/dL (ref >39.00)
LDL Cholesterol: 77 mg/dL (ref 0–99)
NonHDL: 85.85
Total CHOL/HDL Ratio: 2
Triglycerides: 46 mg/dL (ref 0.0–149.0)
VLDL: 9.2 mg/dL (ref 0.0–40.0)

## 2023-03-18 LAB — URINALYSIS, ROUTINE W REFLEX MICROSCOPIC
Bilirubin Urine: NEGATIVE
Ketones, ur: NEGATIVE
Leukocytes,Ua: NEGATIVE
Nitrite: NEGATIVE
Specific Gravity, Urine: <=1.005 — AB (ref 1.000–1.030)
Total Protein, Urine: NEGATIVE
Urine Glucose: NEGATIVE
Urobilinogen, UA: 0.2 (ref 0.0–1.0)
WBC, UA: NONE SEEN (ref 0–?)
pH: 6.5 (ref 5.0–8.0)

## 2023-03-18 LAB — CBC
HCT: 45.7 % (ref 39.0–52.0)
Hemoglobin: 14.9 g/dL (ref 13.0–17.0)
MCHC: 32.6 g/dL (ref 30.0–36.0)
MCV: 97.4 fl (ref 78.0–100.0)
Platelets: 187 10*3/uL (ref 150.0–400.0)
RBC: 4.69 Mil/uL (ref 4.22–5.81)
RDW: 14.2 % (ref 11.5–15.5)
WBC: 6.3 10*3/uL (ref 4.0–10.5)

## 2023-03-18 LAB — PSA: PSA: 2.32 ng/mL (ref 0.10–4.00)

## 2023-03-18 LAB — HEMOGLOBIN A1C: Hgb A1c MFr Bld: 5.7 % (ref 4.6–6.5)

## 2023-03-18 NOTE — Progress Notes (Signed)
Established Patient Office Visit   Subjective:  Patient ID: Kyle Crawford, male    DOB: 27-Apr-1956  Age: 67 y.o. MRN: [id number]  Chief Complaint  Patient presents with   Annual Exam    HPI Encounter Diagnoses  Name Primary?   Healthcare maintenance Yes   Need for shingles vaccine    Screening for prostate cancer    Screening for diabetes mellitus    Elevated LDL cholesterol level    Vitamin D deficiency    Need for influenza vaccination    Here for his physical and doing quite well.  Has been able to lose 12 pounds and his blood pressure is responded favorably.  He is walking every day he is also able to juggle for balls while he is walking.  He has routine dental care and is dealing with significant dental issues right now.  He maintains a low-fat low-cholesterol diet.  He does not smoke or drink alcohol.  Urine flow is okay but there is nocturia.   Review of Systems  Constitutional: Negative.   HENT: Negative.    Eyes:  Negative for blurred vision, discharge and redness.  Respiratory: Negative.    Cardiovascular: Negative.   Gastrointestinal:  Negative for abdominal pain.  Genitourinary: Negative.   Musculoskeletal: Negative.  Negative for myalgias.  Skin:  Negative for rash.  Neurological:  Negative for tingling, loss of consciousness and weakness.  Endo/Heme/Allergies:  Negative for polydipsia.     Current Outpatient Medications:    Vitamin D-Vitamin K (VITAMIN K2-VITAMIN D3 PO), Take 1 tablet by mouth daily., Disp: , Rfl:    Objective:     BP 128/78   Pulse 70   Temp 97.9 F (36.6 C)   Ht 5\' 10"  (1.778 m)   Wt 167 lb (75.8 kg)   SpO2 98%   BMI 23.96 kg/m  BP Readings from Last 3 Encounters:  03/18/23 128/78  02/12/22 136/80  08/13/21 135/84   Wt Readings from Last 3 Encounters:  03/18/23 167 lb (75.8 kg)  01/06/23 167 lb 6.4 oz (75.9 kg)  02/12/22 179 lb 12.8 oz (81.6 kg)      Physical Exam Constitutional:      General: He is not in acute  distress.    Appearance: Normal appearance. He is not ill-appearing, toxic-appearing or diaphoretic.  HENT:     Head: Normocephalic and atraumatic.     Right Ear: Tympanic membrane, ear canal and external ear normal.     Left Ear: Tympanic membrane, ear canal and external ear normal.     Mouth/Throat:     Mouth: Mucous membranes are moist.     Pharynx: Oropharynx is clear. No oropharyngeal exudate or posterior oropharyngeal erythema.  Eyes:     General: No scleral icterus.       Right eye: No discharge.        Left eye: No discharge.     Extraocular Movements: Extraocular movements intact.     Conjunctiva/sclera: Conjunctivae normal.     Pupils: Pupils are equal, round, and reactive to light.  Cardiovascular:     Rate and Rhythm: Normal rate and regular rhythm.  Pulmonary:     Effort: Pulmonary effort is normal. No respiratory distress.     Breath sounds: Normal breath sounds.  Abdominal:     General: Bowel sounds are normal.     Tenderness: There is no abdominal tenderness. There is no guarding or rebound.  Musculoskeletal:     Cervical back: No rigidity or  tenderness.  Skin:    General: Skin is warm and dry.  Neurological:     Mental Status: He is alert and oriented to person, place, and time.  Psychiatric:        Mood and Affect: Mood normal.        Behavior: Behavior normal.      No results found for any visits on 03/18/23.    The ASCVD Risk score (Arnett DK, et al., 2019) failed to calculate for the following reasons:   The valid HDL cholesterol range is 20 to 100 mg/dL    Assessment & Plan:   Healthcare maintenance -     CBC -     Urinalysis, Routine w reflex microscopic  Need for shingles vaccine -     Varicella-zoster vaccine IM  Screening for prostate cancer -     PSA  Screening for diabetes mellitus -     Comprehensive metabolic panel -     Hemoglobin A1c  Elevated LDL cholesterol level -     Comprehensive metabolic panel -     Lipid  panel  Vitamin D deficiency -     VITAMIN D 25 Hydroxy (Vit-D Deficiency, Fractures)  Need for influenza vaccination -     Flu Vaccine Trivalent High Dose (Fluad)    Return in about 1 year (around 03/17/2024), or if symptoms worsen or fail to improve.  Encouraged him to continue his healthy active lifestyle.  Information given on health maintenance and disease prevention.  Information given on preventing high cholesterol.  He does have a quite favorable HDL level.  Advised to fluid restrict a couple hours before bedtime to avoid nocturia.  Mliss Sax, MD

## 2023-07-18 ENCOUNTER — Telehealth: Payer: Self-pay | Admitting: Family Medicine

## 2023-07-18 DIAGNOSIS — S63501A Unspecified sprain of right wrist, initial encounter: Secondary | ICD-10-CM | POA: Diagnosis not present

## 2023-07-18 DIAGNOSIS — S62501A Fracture of unspecified phalanx of right thumb, initial encounter for closed fracture: Secondary | ICD-10-CM | POA: Diagnosis not present

## 2023-07-18 NOTE — Telephone Encounter (Signed)
 ERROR

## 2023-07-22 DIAGNOSIS — D492 Neoplasm of unspecified behavior of bone, soft tissue, and skin: Secondary | ICD-10-CM | POA: Diagnosis not present

## 2023-07-22 DIAGNOSIS — L814 Other melanin hyperpigmentation: Secondary | ICD-10-CM | POA: Diagnosis not present

## 2023-07-22 DIAGNOSIS — L821 Other seborrheic keratosis: Secondary | ICD-10-CM | POA: Diagnosis not present

## 2023-07-22 DIAGNOSIS — D0462 Carcinoma in situ of skin of left upper limb, including shoulder: Secondary | ICD-10-CM | POA: Diagnosis not present

## 2023-07-22 DIAGNOSIS — D225 Melanocytic nevi of trunk: Secondary | ICD-10-CM | POA: Diagnosis not present

## 2023-07-22 DIAGNOSIS — D045 Carcinoma in situ of skin of trunk: Secondary | ICD-10-CM | POA: Diagnosis not present

## 2023-07-30 DIAGNOSIS — M25521 Pain in right elbow: Secondary | ICD-10-CM | POA: Diagnosis not present

## 2023-07-30 DIAGNOSIS — R531 Weakness: Secondary | ICD-10-CM | POA: Diagnosis not present

## 2023-07-30 DIAGNOSIS — M25531 Pain in right wrist: Secondary | ICD-10-CM | POA: Diagnosis not present

## 2023-07-30 DIAGNOSIS — M25621 Stiffness of right elbow, not elsewhere classified: Secondary | ICD-10-CM | POA: Diagnosis not present

## 2023-08-06 DIAGNOSIS — M25621 Stiffness of right elbow, not elsewhere classified: Secondary | ICD-10-CM | POA: Diagnosis not present

## 2023-08-06 DIAGNOSIS — M25521 Pain in right elbow: Secondary | ICD-10-CM | POA: Diagnosis not present

## 2023-08-06 DIAGNOSIS — R531 Weakness: Secondary | ICD-10-CM | POA: Diagnosis not present

## 2023-08-06 DIAGNOSIS — M25531 Pain in right wrist: Secondary | ICD-10-CM | POA: Diagnosis not present

## 2023-08-12 DIAGNOSIS — R531 Weakness: Secondary | ICD-10-CM | POA: Diagnosis not present

## 2023-08-12 DIAGNOSIS — M25531 Pain in right wrist: Secondary | ICD-10-CM | POA: Diagnosis not present

## 2023-08-12 DIAGNOSIS — M25621 Stiffness of right elbow, not elsewhere classified: Secondary | ICD-10-CM | POA: Diagnosis not present

## 2023-08-12 DIAGNOSIS — M25521 Pain in right elbow: Secondary | ICD-10-CM | POA: Diagnosis not present

## 2023-08-19 DIAGNOSIS — M25521 Pain in right elbow: Secondary | ICD-10-CM | POA: Diagnosis not present

## 2023-08-19 DIAGNOSIS — M25531 Pain in right wrist: Secondary | ICD-10-CM | POA: Diagnosis not present

## 2023-08-19 DIAGNOSIS — M25621 Stiffness of right elbow, not elsewhere classified: Secondary | ICD-10-CM | POA: Diagnosis not present

## 2023-08-19 DIAGNOSIS — R531 Weakness: Secondary | ICD-10-CM | POA: Diagnosis not present

## 2023-08-26 DIAGNOSIS — M25521 Pain in right elbow: Secondary | ICD-10-CM | POA: Diagnosis not present

## 2023-08-26 DIAGNOSIS — M25531 Pain in right wrist: Secondary | ICD-10-CM | POA: Diagnosis not present

## 2023-08-26 DIAGNOSIS — M25621 Stiffness of right elbow, not elsewhere classified: Secondary | ICD-10-CM | POA: Diagnosis not present

## 2023-09-02 DIAGNOSIS — M25521 Pain in right elbow: Secondary | ICD-10-CM | POA: Diagnosis not present

## 2023-09-02 DIAGNOSIS — M25531 Pain in right wrist: Secondary | ICD-10-CM | POA: Diagnosis not present

## 2023-09-02 DIAGNOSIS — M25621 Stiffness of right elbow, not elsewhere classified: Secondary | ICD-10-CM | POA: Diagnosis not present

## 2023-09-02 DIAGNOSIS — R531 Weakness: Secondary | ICD-10-CM | POA: Diagnosis not present

## 2023-09-09 DIAGNOSIS — M25531 Pain in right wrist: Secondary | ICD-10-CM | POA: Diagnosis not present

## 2023-09-09 DIAGNOSIS — M25521 Pain in right elbow: Secondary | ICD-10-CM | POA: Diagnosis not present

## 2023-09-09 DIAGNOSIS — R531 Weakness: Secondary | ICD-10-CM | POA: Diagnosis not present

## 2023-09-16 DIAGNOSIS — D0462 Carcinoma in situ of skin of left upper limb, including shoulder: Secondary | ICD-10-CM | POA: Diagnosis not present

## 2023-09-16 DIAGNOSIS — D045 Carcinoma in situ of skin of trunk: Secondary | ICD-10-CM | POA: Diagnosis not present

## 2023-12-17 DIAGNOSIS — D225 Melanocytic nevi of trunk: Secondary | ICD-10-CM | POA: Diagnosis not present

## 2023-12-17 DIAGNOSIS — L814 Other melanin hyperpigmentation: Secondary | ICD-10-CM | POA: Diagnosis not present

## 2023-12-17 DIAGNOSIS — L821 Other seborrheic keratosis: Secondary | ICD-10-CM | POA: Diagnosis not present

## 2023-12-25 ENCOUNTER — Encounter: Payer: Self-pay | Admitting: Family Medicine

## 2023-12-25 ENCOUNTER — Ambulatory Visit: Payer: Self-pay

## 2023-12-25 NOTE — Telephone Encounter (Signed)
 FYI Only or Action Required?: Action required by provider: request for appointment and clinical question for provider.  Patient was last seen in primary care on 03/18/2023 by Berneta Elsie Sayre, MD. Called Nurse Triage reporting Abdominal Pain. Symptoms began several days ago. Interventions attempted: Rest, hydration, or home remedies. Symptoms are: stable.  Triage Disposition: See Physician Within 24 Hours  Patient/caregiver understands and will follow disposition?: No, wishes to speak with PCP  Copied from CRM [id number]. Topic: Clinical - Red Word Triage >> Dec 25, 2023  3:59 PM Mesmerise C wrote: Red Word that prompted transfer to Nurse Triage: Patient stated he's experiencing a burning sensation along with pain on his stomach, has limited mobility when he moves and feels pain, believes it's from his hernia operation Reason for Disposition  [1] MILD pain (e.g., does not interfere with normal activities) AND [2] comes and goes (cramps) AND [3] present > 72 hours  (Exception: This same abdominal pain is a chronic symptom recurrent or ongoing AND present > 4 weeks.)  Answer Assessment - Initial Assessment Questions 1. LOCATION: Where does it hurt?      Primary upper left abdominal pain that at times will be in the lower left  2. RADIATION: Does the pain shoot anywhere else? (e.g., chest, back)     no 3. ONSET: When did the pain begin? (e.g., minutes, hours or days ago)      Started 10 days ago 4. SUDDEN: Gradual or sudden onset?     sudden 5. PATTERN Does the pain come and go, or is it constant?    - If it comes and goes: How long does it last? Do you have pain now?     (Note: Comes and goes means the pain is intermittent. It goes away completely between bouts.)    - If constant: Is it getting better, staying the same, or getting worse?      (Note: Constant means the pain never goes away completely; most serious pain is constant and gets worse.)      Comes and goes 6.  SEVERITY: How bad is the pain?  (e.g., Scale 1-10; mild, moderate, or severe)    - MILD (1-3): Doesn't interfere with normal activities, abdomen soft and not tender to touch..     - MODERATE (4-7): Interferes with normal activities or awakens from sleep, abdomen tender to touch.     - SEVERE (8-10): Excruciating pain, doubled over, unable to do any normal activities.       2 out of 10 7. RECURRENT SYMPTOM: Have you ever had this type of stomach pain before? If Yes, ask: When was the last time? and What happened that time?      yes 8. AGGRAVATING FACTORS: Does anything seem to cause this pain? (e.g., foods, stress, alcohol)     food 9. CARDIAC SYMPTOMS: Do you have any of the following symptoms: chest pain, difficulty breathing, sweating, nausea?     no 10. OTHER SYMPTOMS: Do you have any other symptoms? (e.g., back pain, diarrhea, fever, urination pain, vomiting)       no  Protocols used: Abdominal Pain - Upper-A-AH

## 2023-12-26 ENCOUNTER — Encounter: Payer: Self-pay | Admitting: Family Medicine

## 2023-12-29 ENCOUNTER — Ambulatory Visit: Payer: Self-pay | Admitting: Family Medicine

## 2023-12-29 ENCOUNTER — Encounter: Payer: Self-pay | Admitting: Family Medicine

## 2023-12-29 ENCOUNTER — Ambulatory Visit (INDEPENDENT_AMBULATORY_CARE_PROVIDER_SITE_OTHER): Admitting: Family Medicine

## 2023-12-29 VITALS — BP 130/84 | HR 56 | Temp 97.4°F | Ht 70.5 in | Wt 173.4 lb

## 2023-12-29 DIAGNOSIS — R1905 Periumbilic swelling, mass or lump: Secondary | ICD-10-CM | POA: Diagnosis not present

## 2023-12-29 LAB — COMPREHENSIVE METABOLIC PANEL WITH GFR
ALT: 20 U/L (ref 0–53)
AST: 20 U/L (ref 0–37)
Albumin: 4.5 g/dL (ref 3.5–5.2)
Alkaline Phosphatase: 79 U/L (ref 39–117)
BUN: 16 mg/dL (ref 6–23)
CO2: 29 meq/L (ref 19–32)
Calcium: 9.7 mg/dL (ref 8.4–10.5)
Chloride: 105 meq/L (ref 96–112)
Creatinine, Ser: 0.86 mg/dL (ref 0.40–1.50)
GFR: 88.06 mL/min (ref >60.00)
Glucose, Bld: 97 mg/dL (ref 70–99)
Potassium: 4.1 meq/L (ref 3.5–5.1)
Sodium: 140 meq/L (ref 135–145)
Total Bilirubin: 0.5 mg/dL (ref 0.2–1.2)
Total Protein: 6.9 g/dL (ref 6.0–8.3)

## 2023-12-29 LAB — URINALYSIS, ROUTINE W REFLEX MICROSCOPIC
Bilirubin Urine: NEGATIVE
Ketones, ur: NEGATIVE
Leukocytes,Ua: NEGATIVE
Nitrite: NEGATIVE
Specific Gravity, Urine: <=1.005 — AB (ref 1.000–1.030)
Total Protein, Urine: NEGATIVE
Urine Glucose: NEGATIVE
Urobilinogen, UA: 0.2 (ref 0.0–1.0)
WBC, UA: NONE SEEN (ref 0–?)
pH: 7 (ref 5.0–8.0)

## 2023-12-29 LAB — CBC WITH DIFFERENTIAL/PLATELET
Basophils Absolute: 0.1 10*3/uL (ref 0.0–0.1)
Basophils Relative: 0.8 % (ref 0.0–3.0)
Eosinophils Absolute: 0.1 10*3/uL (ref 0.0–0.7)
Eosinophils Relative: 2.4 % (ref 0.0–5.0)
HCT: 43.9 % (ref 39.0–52.0)
Hemoglobin: 14.6 g/dL (ref 13.0–17.0)
Lymphocytes Relative: 18.2 % (ref 12.0–46.0)
Lymphs Abs: 1.1 10*3/uL (ref 0.7–4.0)
MCHC: 33.3 g/dL (ref 30.0–36.0)
MCV: 96.2 fl (ref 78.0–100.0)
Monocytes Absolute: 0.6 10*3/uL (ref 0.1–1.0)
Monocytes Relative: 10.6 % (ref 3.0–12.0)
Neutro Abs: 4.1 10*3/uL (ref 1.4–7.7)
Neutrophils Relative %: 68 % (ref 43.0–77.0)
Platelets: 189 10*3/uL (ref 150.0–400.0)
RBC: 4.57 Mil/uL (ref 4.22–5.81)
RDW: 14.1 % (ref 11.5–15.5)
WBC: 6 10*3/uL (ref 4.0–10.5)

## 2023-12-29 LAB — AMYLASE: Amylase: 69 U/L (ref 27–131)

## 2023-12-29 LAB — LIPASE: Lipase: 35 U/L (ref 11.0–59.0)

## 2023-12-29 NOTE — Progress Notes (Signed)
 Established Patient Office Visit   Subjective:  Patient ID: Kyle Crawford, male    DOB: 05-Mar-1956  Age: 68 y.o. MRN: [id number]  Chief Complaint  Patient presents with   Abdominal Pain    Left side abdominal pain. Off and on for 5 years. Pt states there is painful knot with certain movements that take a few days toe resolve.    Gastroesophageal Reflux    Acid reflux. Pt states he experiences gerd when eating after eating.     Abdominal Pain Pertinent negatives include no myalgias. His past medical history is significant for GERD.  Gastroesophageal Reflux He complains of abdominal pain.   Encounter Diagnoses  Name Primary?   Abdominal wall mass of periumbilical region Yes   Patient has been experiencing an intermittent bulge or mass in his left abdominal wall area with certain movements through his body.  Tends to come out when he reaches down to cut his toenails.  There is some pain associated with it.  History of hiatal hernia with intermittent reflux.  History of left inguinal hernia repair.  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   Review of Systems  Constitutional: Negative.   HENT: Negative.    Eyes:  Negative for blurred vision, discharge and redness.  Respiratory: Negative.    Cardiovascular: Negative.   Gastrointestinal:  Positive for abdominal pain.  Genitourinary: Negative.   Musculoskeletal: Negative.  Negative for myalgias.  Skin:  Negative for rash.  Neurological:  Negative for tingling, loss of consciousness and weakness.  Endo/Heme/Allergies:  Negative for polydipsia.     Current Outpatient Medications:    Vitamin D -Vitamin K (VITAMIN K2-VITAMIN D3 PO), Take 1 tablet by mouth daily., Disp: , Rfl:    Objective:     BP 130/84 (Cuff Size: Normal)   Pulse (!) 56   Temp (!) 97.4 F (36.3 C) (Temporal)   Ht 5' 10.5 (1.791 m)   Wt 173 lb 6.4 oz (78.7 kg)   SpO2 97%   BMI 24.53 kg/m    Physical Exam Constitutional:      General:  He is not in acute distress.    Appearance: Normal appearance. He is not ill-appearing, toxic-appearing or diaphoretic.  HENT:     Head: Normocephalic and atraumatic.     Right Ear: External ear normal.     Left Ear: External ear normal.     Mouth/Throat:     Mouth: Mucous membranes are moist.     Pharynx: Oropharynx is clear. No oropharyngeal exudate or posterior oropharyngeal erythema.   Eyes:     General: No scleral icterus.       Right eye: No discharge.        Left eye: No discharge.     Extraocular Movements: Extraocular movements intact.     Conjunctiva/sclera: Conjunctivae normal.     Pupils: Pupils are equal, round, and reactive to light.    Cardiovascular:     Rate and Rhythm: Normal rate and regular rhythm.  Pulmonary:     Effort: Pulmonary effort is normal. No respiratory distress.     Breath sounds: Normal breath sounds.  Abdominal:     General: Abdomen is flat. Bowel sounds are normal.     Palpations: Abdomen is soft.     Tenderness: There is no abdominal tenderness. There is no guarding.     Hernia: There is no hernia in the umbilical area, ventral area, left inguinal area or right inguinal area.  Genitourinary:    Penis:  Circumcised. No hypospadias, erythema, tenderness, discharge, swelling or lesions.      Testes:        Left: Mass, tenderness or swelling not present. Left testis is descended.     Epididymis:     Right: Not inflamed or enlarged.     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  Musculoskeletal:     Cervical back: No rigidity or tenderness.  Lymphadenopathy:     Lower Body: No right inguinal adenopathy. No left inguinal adenopathy.   Skin:    General: Skin is warm and dry.   Neurological:     Mental Status: He is alert and oriented to person, place, and time.   Psychiatric:        Mood and Affect: Mood normal.        Behavior: Behavior normal.      No results found for any visits on 12/29/23.    The ASCVD Risk score (Arnett DK, et  al., 2019) failed to calculate for the following reasons:   The valid HDL cholesterol range is 20 to 100 mg/dL    Assessment & Plan:   Abdominal wall mass of periumbilical region -     CT ABDOMEN PELVIS W CONTRAST; Future -     CT ABDOMEN PELVIS WO CONTRAST; Future -     Amylase -     CBC with Differential/Platelet -     Comprehensive metabolic panel with GFR -     Lipase -     Urinalysis, Routine w reflex microscopic    Return Has appointment for physical in a few weeks..  Further workup and/or consultation pends results of above  Elsie Sim Lent, MD

## 2024-01-01 ENCOUNTER — Ambulatory Visit
Admission: RE | Admit: 2024-01-01 | Discharge: 2024-01-01 | Disposition: A | Discharge status: Home / Self Care | Source: Ambulatory Visit | Attending: Family Medicine | Admitting: Family Medicine

## 2024-01-01 ENCOUNTER — Encounter: Payer: Self-pay | Admitting: Radiology

## 2024-01-01 DIAGNOSIS — R1905 Periumbilic swelling, mass or lump: Secondary | ICD-10-CM

## 2024-01-01 DIAGNOSIS — N400 Enlarged prostate without lower urinary tract symptoms: Secondary | ICD-10-CM | POA: Diagnosis not present

## 2024-01-01 DIAGNOSIS — I700 Atherosclerosis of aorta: Secondary | ICD-10-CM | POA: Diagnosis not present

## 2024-01-01 DIAGNOSIS — K4020 Bilateral inguinal hernia, without obstruction or gangrene, not specified as recurrent: Secondary | ICD-10-CM | POA: Diagnosis not present

## 2024-01-01 MED ORDER — IOPAMIDOL (ISOVUE-300) INJECTION 61%
100.0000 mL | Freq: Once | INTRAVENOUS | Status: AC | PRN
  Administered 2024-01-01: 100 mL via INTRAVENOUS

## 2024-01-12 ENCOUNTER — Ambulatory Visit (INDEPENDENT_AMBULATORY_CARE_PROVIDER_SITE_OTHER): Payer: PPO

## 2024-01-12 DIAGNOSIS — Z0000 Encounter for general adult medical examination without abnormal findings: Secondary | ICD-10-CM

## 2024-01-12 NOTE — Patient Instructions (Signed)
 Kyle Crawford , Thank you for taking time out of your busy schedule to complete your Annual Wellness Visit with me. I enjoyed our conversation and look forward to speaking with you again next year. I, as well as your care team,  appreciate your ongoing commitment to your health goals. Please review the following plan we discussed and let me know if I can assist you in the future. Your Game plan/ To Do List    Referrals: If you haven't heard from the office you've been referred to, please reach out to them at the phone provided.  N/a Follow up Visits: Next Medicare AWV with our clinical staff: 01/14/2025 at 8:10   Have you seen your provider in the last 6 months (3 months if uncontrolled diabetes)? Yes Next Office Visit with your provider: 03/23/2024 at 9:00  Clinician Recommendations:  Aim for 30 minutes of exercise or brisk walking, 6-8 glasses of water, and 5 servings of fruits and vegetables each day.       This is a list of the screening recommended for you and due dates:  Health Maintenance  Topic Date Due   Pneumococcal Vaccine for age over 50 (1 of 1 - PCV) Never done   Flu Shot  01/30/2024   Medicare Annual Wellness Visit  01/11/2025   DTaP/Tdap/Td vaccine (3 - Td or Tdap) 11/19/2027   Colon Cancer Screening  04/03/2028   Hepatitis C Screening  Completed   Zoster (Shingles) Vaccine  Completed   Hepatitis B Vaccine  Aged Out   HPV Vaccine  Aged Out   Meningitis B Vaccine  Aged Out   COVID-19 Vaccine  Discontinued    Advanced directives: (ACP Link)Information on Advanced Care Planning can be found at [place]  Secretary of [facility] Advance Health Care Directives Advance Health Care Directives. [email or web address]  Advance Care Planning is important because it:  [x]  Makes sure you receive the medical care that is consistent with your values, goals, and preferences  [x]  It provides guidance to your family and loved ones and reduces their decisional burden about whether or not they are making  the right decisions based on your wishes.  Follow the link provided in your after visit summary or read over the paperwork we have mailed to you to help you started getting your Advance Directives in place. If you need assistance in completing these, please reach out to us  so that we can help you!  See attachments for Preventive Care and Fall Prevention Tips.

## 2024-01-12 NOTE — Progress Notes (Signed)
 Subjective:   Kyle Crawford is a 68 y.o. who presents for a Medicare Wellness preventive visit.  As a reminder, Annual Wellness Visits don't include a physical exam, and some assessments may be limited, especially if this visit is performed virtually. We may recommend an in-person follow-up visit with your provider if needed.  Visit Complete: Virtual I connected with  [name] on 01/12/24 by a audio enabled telemedicine application and verified that I am speaking with the correct person using two identifiers.  Patient Location: Home  Provider Location: Office/Clinic  I discussed the limitations of evaluation and management by telemedicine. The patient expressed understanding and agreed to proceed.  Vital Signs: Because this visit was a virtual/telehealth visit, some criteria may be missing or patient reported. Any vitals not documented were not able to be obtained and vitals that have been documented are patient reported.  VideoError- Librarian, academic were attempted between this provider and patient, however failed, due to patient having technical difficulties OR patient did not have access to video capability.  We continued and completed visit with audio only.   Persons Participating in Visit: Patient.  AWV Questionnaire: Yes: Patient Medicare AWV questionnaire was completed by the patient on 01/08/2024; I have confirmed that all information answered by patient is correct and no changes since this date.  Cardiac Risk Factors include: advanced age (>34men, >56 women);hypertension;male gender     Objective:    Today's Vitals   There is no height or weight on file to calculate BMI.     01/12/2024    8:11 AM 01/06/2023    8:19 AM 01/02/2022    8:23 AM 12/12/2020    9:12 AM  Advanced Directives  Does Patient Have a Medical Advance Directive? No No No No  Would patient like information on creating a medical advance directive?   No - Patient  declined No - Patient declined    Current Medications (verified) Outpatient Encounter Medications as of 01/12/2024  Medication Sig   Vitamin D -Vitamin K (VITAMIN K2-VITAMIN D3 PO) Take 1 tablet by mouth daily.   No facility-administered encounter medications on file as of 01/12/2024.    Allergies (verified) Thimerosal (thiomersal)   History: Past Medical History:  Diagnosis Date   Arthritis    History of Barrett's esophagus    Hypertension    Past Surgical History:  Procedure Laterality Date   arm fracture surgery     COLONOSCOPY  2017   DENTAL SURGERY     EPIDIDYMIS SURGERY     EYE SURGERY  radial keratotomy 1991   FRACTURE SURGERY  Right Ulna, Left Index finger   INGUINAL HERNIA REPAIR     NERVE GRAFT     POLYPECTOMY     tendon repair foot/ankle     UPPER GASTROINTESTINAL ENDOSCOPY  1998   Family History  Problem Relation Age of Onset   Alzheimer's disease Father    Bladder Cancer Father    Diabetes Sister    Diabetes Maternal Grandmother    Heart attack Maternal Grandfather    Colon cancer Neg Hx    Esophageal cancer Neg Hx    Stomach cancer Neg Hx    Rectal cancer Neg Hx    Social History   Socioeconomic History   Marital status: Single    Spouse name: Not on file   Number of children: Not on file   Years of education: Not on file   Highest education level: Bachelor's degree (e.g., BA, AB,  BS)  Occupational History   Not on file  Tobacco Use   Smoking status: Former   Smokeless tobacco: Never   Tobacco comments:    many years ago.  Vaping Use   Vaping status: Never Used  Substance and Sexual Activity   Alcohol use: Not Currently   Drug use: Not Currently    Types: Marijuana   Sexual activity: Not on file  Other Topics Concern   Not on file  Social History Narrative   Not on file   Social Drivers of Health   Financial Resource Strain: Low Risk  (01/12/2024)   Overall Financial Resource Strain (CARDIA)    Difficulty of Paying Living  Expenses: Not hard at all  Food Insecurity: No Food Insecurity (01/12/2024)   Hunger Vital Sign    Worried About Running Out of Food in the Last Year: Never true    Ran Out of Food in the Last Year: Never true  Transportation Needs: No Transportation Needs (01/12/2024)   PRAPARE - Administrator, Civil Service (Medical): No    Lack of Transportation (Non-Medical): No  Physical Activity: Sufficiently Active (01/12/2024)   Exercise Vital Sign    Days of Exercise per Week: 7 days    Minutes of Exercise per Session: 60 min  Stress: No Stress Concern Present (01/12/2024)   [employer] of Occupational Health - Occupational Stress Questionnaire    Feeling of Stress: Not at all  Social Connections: Socially Isolated (01/12/2024)   Social Connection and Isolation Panel    Frequency of Communication with Friends and Family: More than three times a week    Frequency of Social Gatherings with Friends and Family: Once a week    Attends Religious Services: Never    Database administrator or Organizations: No    Attends Banker Meetings: Never    Marital Status: Never married    Tobacco Counseling Counseling given: Not Answered Tobacco comments: many years ago.    Clinical Intake:  Pre-visit preparation completed: Yes  Pain : No/denies pain     Nutritional Risks: None Diabetes: No  Lab Results  Component Value Date   HGBA1C 5.7 03/18/2023   HGBA1C 5.6 11/09/2020     How often do you need to have someone help you when you read instructions, pamphlets, or other written materials from your doctor or pharmacy?: 1 - Never  Interpreter Needed?: No  Information entered by :: [name]   Activities of Daily Living     01/08/2024    2:01 PM  In your present state of health, do you have any difficulty performing the following activities:  Hearing? 0  Vision? 0  Difficulty concentrating or making decisions? 0  Walking or climbing stairs? 0  Dressing or  bathing? 0  Doing errands, shopping? 0  Preparing Food and eating ? N  Using the Toilet? N  In the past six months, have you accidently leaked urine? N  Do you have problems with loss of bowel control? N  Managing your Medications? N  Managing your Finances? N  Housekeeping or managing your Housekeeping? N    Patient Care Team: Berneta Elsie Sayre, MD as PCP - General (Family Medicine)  I have updated your Care Teams any recent Medical Services you may have received from other providers in the past year.     Assessment:   This is a routine wellness examination for Rober.  Hearing/Vision screen Hearing Screening - Comments:: Denies hearing issues Vision  Screening - Comments:: Regular eye exams, [place]   Goals Addressed             This Visit's Progress    Patient Stated       01/12/2024, wants to strengthen joints       Depression Screen     01/12/2024    8:12 AM 03/18/2023    8:33 AM 01/06/2023    8:20 AM 02/12/2022    8:58 AM 01/02/2022    8:24 AM 01/02/2022    8:22 AM 02/09/2021   11:01 AM  PHQ 2/9 Scores  PHQ - 2 Score 0 0 0 0 0 0 0  PHQ- 9 Score 0 0 0        Fall Risk     01/08/2024    2:01 PM 03/18/2023    8:33 AM 12/30/2022   10:29 AM 02/12/2022    8:58 AM 01/02/2022    8:24 AM  Fall Risk   Falls in the past year? 0 0 0 0 0  Number falls in past yr: 0 0 0 0 0  Injury with Fall? 0 0 0  0  Risk for fall due to : No Fall Risks No Fall Risks No Fall Risks  No Fall Risks  Follow up Falls prevention discussed;Falls evaluation completed Falls evaluation completed Falls prevention discussed;Falls evaluation completed  Falls evaluation completed      Data saved with a previous flowsheet row definition    MEDICARE RISK AT HOME:  Medicare Risk at Home Any stairs in or around the home?: (Patient-Rptd) Yes If so, are there any without handrails?: (Patient-Rptd) No Home free of loose throw rugs in walkways, pet beds, electrical cords, etc?: (Patient-Rptd)  Yes Adequate lighting in your home to reduce risk of falls?: (Patient-Rptd) Yes Life alert?: (Patient-Rptd) No Use of a cane, walker or w/c?: (Patient-Rptd) No Grab bars in the bathroom?: (Patient-Rptd) No Shower chair or bench in shower?: (Patient-Rptd) No Elevated toilet seat or a handicapped toilet?: (Patient-Rptd) No  TIMED UP AND GO:  Was the test performed?  No  Cognitive Function: 6CIT completed        01/12/2024    8:14 AM 01/06/2023    8:21 AM  6CIT Screen  What Year? 0 points 0 points  What month? 0 points 0 points  What time? 0 points 0 points  Count back from 20 0 points 0 points  Months in reverse 0 points 0 points  Repeat phrase 0 points 0 points  Total Score 0 points 0 points    Immunizations Immunization History  Administered Date(s) Administered   Fluad Quad(high Dose 65+) 05/06/2019   Fluad Trivalent(High Dose 65+) 03/18/2023   Influenza-Unspecified 03/18/2021   PFIZER(Purple Top)SARS-COV-2 Vaccination 08/07/2019, 09/01/2019, 04/11/2020, 10/11/2020, 03/18/2021   Pfizer Covid-19 Vaccine Bivalent Booster 56yrs & up 03/18/2021, 09/22/2021   Tdap 06/20/2010, 11/18/2017   Zoster Recombinant(Shingrix ) 09/22/2021, 03/18/2023    Screening Tests Health Maintenance  Topic Date Due   Pneumococcal Vaccine: 50+ Years (1 of 1 - PCV) Never done   INFLUENZA VACCINE  01/30/2024   Medicare Annual Wellness (AWV)  01/11/2025   DTaP/Tdap/Td (3 - Td or Tdap) 11/19/2027   Colonoscopy  04/03/2028   Hepatitis C Screening  Completed   Zoster Vaccines- Shingrix   Completed   Hepatitis B Vaccines  Aged Out   HPV VACCINES  Aged Out   Meningococcal B Vaccine  Aged Out   COVID-19 Vaccine  Discontinued    Health Maintenance  Health Maintenance Due  Topic Date Due   Pneumococcal Vaccine: 50+ Years (1 of 1 - PCV) Never done   Health Maintenance Items Addressed: Due for pneumonia vaccine.  Additional Screening:  Vision Screening: Recommended annual ophthalmology exams  for early detection of glaucoma and other disorders of the eye. Would you like a referral to an eye doctor? No    Dental Screening: Recommended annual dental exams for proper oral hygiene  Community Resource Referral / Chronic Care Management: CRR required this visit?  No   CCM required this visit?  No   Plan:    I have personally reviewed and noted the following in the patient's chart:   Medical and social history Use of alcohol, tobacco or illicit drugs  Current medications and supplements including opioid prescriptions. Patient is not currently taking opioid prescriptions. Functional ability and status Nutritional status Physical activity Advanced directives List of other physicians Hospitalizations, surgeries, and ER visits in previous 12 months Vitals Screenings to include cognitive, depression, and falls Referrals and appointments  In addition, I have reviewed and discussed with patient certain preventive protocols, quality metrics, and best practice recommendations. A written personalized care plan for preventive services as well as general preventive health recommendations were provided to patient.   Ardella FORBES Dawn, LPN   2/85/7974   After Visit Summary: (MyChart) Due to this being a telephonic visit, the after visit summary with patients personalized plan was offered to patient via MyChart   Notes: Nothing significant to report at this time.

## 2024-02-24 ENCOUNTER — Encounter: Payer: PPO | Admitting: Family Medicine

## 2024-03-22 ENCOUNTER — Encounter: Payer: PPO | Admitting: Family Medicine

## 2024-03-23 ENCOUNTER — Encounter: Payer: PPO | Admitting: Family Medicine

## 2024-03-29 ENCOUNTER — Encounter: Payer: Self-pay | Admitting: Family Medicine

## 2024-03-29 ENCOUNTER — Ambulatory Visit (INDEPENDENT_AMBULATORY_CARE_PROVIDER_SITE_OTHER): Admitting: Family Medicine

## 2024-03-29 VITALS — BP 126/72 | HR 56 | Temp 97.5°F | Ht 70.0 in | Wt 158.2 lb

## 2024-03-29 DIAGNOSIS — Z23 Encounter for immunization: Secondary | ICD-10-CM | POA: Diagnosis not present

## 2024-03-29 DIAGNOSIS — Z125 Encounter for screening for malignant neoplasm of prostate: Secondary | ICD-10-CM

## 2024-03-29 DIAGNOSIS — R109 Unspecified abdominal pain: Secondary | ICD-10-CM

## 2024-03-29 DIAGNOSIS — K5901 Slow transit constipation: Secondary | ICD-10-CM | POA: Diagnosis not present

## 2024-03-29 DIAGNOSIS — E7800 Pure hypercholesterolemia, unspecified: Secondary | ICD-10-CM | POA: Diagnosis not present

## 2024-03-29 DIAGNOSIS — Z0000 Encounter for general adult medical examination without abnormal findings: Secondary | ICD-10-CM

## 2024-03-29 DIAGNOSIS — Z131 Encounter for screening for diabetes mellitus: Secondary | ICD-10-CM | POA: Diagnosis not present

## 2024-03-29 LAB — COMPREHENSIVE METABOLIC PANEL WITH GFR
ALT: 22 U/L (ref 0–53)
AST: 21 U/L (ref 0–37)
Albumin: 4.8 g/dL (ref 3.5–5.2)
Alkaline Phosphatase: 77 U/L (ref 39–117)
BUN: 11 mg/dL (ref 6–23)
CO2: 32 meq/L (ref 19–32)
Calcium: 10.1 mg/dL (ref 8.4–10.5)
Chloride: 102 meq/L (ref 96–112)
Creatinine, Ser: 0.84 mg/dL (ref 0.40–1.50)
GFR: 88.54 mL/min (ref >60.00)
Glucose, Bld: 101 mg/dL — ABNORMAL HIGH (ref 70–99)
Potassium: 4.9 meq/L (ref 3.5–5.1)
Sodium: 139 meq/L (ref 135–145)
Total Bilirubin: 0.7 mg/dL (ref 0.2–1.2)
Total Protein: 6.9 g/dL (ref 6.0–8.3)

## 2024-03-29 LAB — CBC
HCT: 43.9 % (ref 39.0–52.0)
Hemoglobin: 14.5 g/dL (ref 13.0–17.0)
MCHC: 33 g/dL (ref 30.0–36.0)
MCV: 97 fl (ref 78.0–100.0)
Platelets: 188 K/uL (ref 150.0–400.0)
RBC: 4.52 Mil/uL (ref 4.22–5.81)
RDW: 13.7 % (ref 11.5–15.5)
WBC: 4.7 K/uL (ref 4.0–10.5)

## 2024-03-29 LAB — LIPID PANEL
Cholesterol: 218 mg/dL — ABNORMAL HIGH (ref 0–200)
HDL: 98 mg/dL (ref >39.00)
LDL Cholesterol: 108 mg/dL — ABNORMAL HIGH (ref 0–99)
NonHDL: 120.07
Total CHOL/HDL Ratio: 2
Triglycerides: 59 mg/dL (ref 0.0–149.0)
VLDL: 11.8 mg/dL (ref 0.0–40.0)

## 2024-03-29 LAB — URINALYSIS, ROUTINE W REFLEX MICROSCOPIC
Bilirubin Urine: NEGATIVE
Hgb urine dipstick: NEGATIVE
Ketones, ur: NEGATIVE
Leukocytes,Ua: NEGATIVE
Nitrite: NEGATIVE
RBC / HPF: NONE SEEN (ref 0–?)
Specific Gravity, Urine: <=1.005 — AB (ref 1.000–1.030)
Total Protein, Urine: NEGATIVE
Urine Glucose: NEGATIVE
Urobilinogen, UA: 0.2 (ref 0.0–1.0)
WBC, UA: NONE SEEN (ref 0–?)
pH: 7.5 (ref 5.0–8.0)

## 2024-03-29 LAB — HEMOGLOBIN A1C: Hgb A1c MFr Bld: 6.1 % (ref 4.6–6.5)

## 2024-03-29 LAB — PSA: PSA: 3.2 ng/mL (ref 0.10–4.00)

## 2024-03-29 MED ORDER — POLYETHYLENE GLYCOL 3350 17 GM/SCOOP PO POWD: 17.0000 g | Freq: Two times a day (BID) | ORAL | 1 refills | Status: AC | PRN

## 2024-03-29 NOTE — Progress Notes (Addendum)
 Established Patient Office Visit   Subjective:  Patient ID: Kyle Crawford, male    DOB: 12-May-1956  Age: 68 y.o. MRN: [id number]  Chief Complaint  Patient presents with   Abdominal Pain    Follow up. Pt had Ct done in July. Pt states there is intermittent pain. Pt has currently been on liquid diet for 3 days that's help alleviate the pain. Pt states when he leans forward the pain gets worse.     Abdominal Pain Associated symptoms include constipation. Pertinent negatives include no diarrhea, melena, myalgias or vomiting.   Encounter Diagnoses  Name Primary?   Healthcare maintenance Yes   Abdominal pain, unspecified abdominal location    Slow transit constipation    Screening for diabetes mellitus    Screening for prostate cancer    Elevated LDL cholesterol level    Immunization due    Here for a physical and follow-up of abdominal pain with a question of abdominal wall mass and constipation.  Pain is intermittent.  He has no fevers chills, hematochezia or melena.  CT scan was negative for abdominal wall mass.  It did show a small inguinal hernia on the right.  He is accompanied by his sister today.   Review of Systems  Constitutional: Negative.   HENT: Negative.    Eyes:  Negative for blurred vision, discharge and redness.  Respiratory: Negative.    Cardiovascular: Negative.   Gastrointestinal:  Positive for abdominal pain and constipation. Negative for blood in stool, diarrhea, melena and vomiting.  Genitourinary: Negative.   Musculoskeletal: Negative.  Negative for myalgias.  Skin:  Negative for rash.  Neurological:  Negative for tingling, loss of consciousness and weakness.  Endo/Heme/Allergies:  Negative for polydipsia.      01/12/2024    8:12 AM 03/18/2023    8:33 AM 01/06/2023    8:20 AM  Depression screen PHQ 2/9  Decreased Interest 0 0 0  Down, Depressed, Hopeless 0 0 0  PHQ - 2 Score 0 0 0  Altered sleeping 0 0 0  Tired, decreased energy 0 0 0  Change in  appetite 0 0 0  Feeling bad or failure about yourself  0 0 0  Trouble concentrating 0 0 0  Moving slowly or fidgety/restless 0 0 0  Suicidal thoughts 0 0 0  PHQ-9 Score 0 0 0  Difficult doing work/chores Not difficult at all Not difficult at all Not difficult at all      Current Outpatient Medications:    atorvastatin (LIPITOR) 20 MG tablet, Take 1 tablet (20 mg total) by mouth daily., Disp: 90 tablet, Rfl: 3   polyethylene glycol powder (GLYCOLAX/MIRALAX) 17 GM/SCOOP powder, Take 17 g by mouth 2 (two) times daily as needed. Dissolve 1 capful (17g) in 4-8 ounces of liquid and take by mouth daily., Disp: 3350 g, Rfl: 1   Objective:     BP 126/72 (BP Location: Left Arm, Patient Position: Sitting)   Pulse (!) 56   Temp (!) 97.5 F (36.4 C) (Temporal)   Ht 5' 10 (1.778 m)   Wt 158 lb 3.2 oz (71.8 kg)   SpO2 98%   BMI 22.70 kg/m    Physical Exam Constitutional:      General: He is not in acute distress.    Appearance: Normal appearance. He is not ill-appearing, toxic-appearing or diaphoretic.  HENT:     Head: Normocephalic and atraumatic.     Right Ear: External ear normal.     Left Ear: External  ear normal.     Mouth/Throat:     Mouth: Mucous membranes are moist.     Pharynx: Oropharynx is clear. No oropharyngeal exudate or posterior oropharyngeal erythema.  Eyes:     General: No scleral icterus.       Right eye: No discharge.        Left eye: No discharge.     Extraocular Movements: Extraocular movements intact.     Conjunctiva/sclera: Conjunctivae normal.     Pupils: Pupils are equal, round, and reactive to light.  Cardiovascular:     Rate and Rhythm: Normal rate and regular rhythm.  Pulmonary:     Effort: Pulmonary effort is normal. No respiratory distress.     Breath sounds: Normal breath sounds. No wheezing or rales.  Abdominal:     General: Bowel sounds are normal.     Tenderness: There is no abdominal tenderness. There is no guarding or rebound.     Hernia:  There is no hernia in the umbilical area, ventral area, left inguinal area, right femoral area, left femoral area or right inguinal area.  Genitourinary:    Testes: Normal.        Right: Mass, tenderness or swelling not present.        Left: Mass, tenderness or swelling not present.  Musculoskeletal:     Cervical back: No rigidity or tenderness.  Skin:    General: Skin is warm and dry.  Neurological:     Mental Status: He is alert and oriented to person, place, and time.  Psychiatric:        Mood and Affect: Mood normal.        Behavior: Behavior normal.      Results for orders placed or performed in visit on 03/29/24  Comprehensive metabolic panel with GFR  Result Value Ref Range   Sodium 139 135 - 145 mEq/L   Potassium 4.9 3.5 - 5.1 mEq/L   Chloride 102 96 - 112 mEq/L   CO2 32 19 - 32 mEq/L   Glucose, Bld 101 (H) 70 - 99 mg/dL   BUN 11 6 - 23 mg/dL   Creatinine, Ser 9.15 0.40 - 1.50 mg/dL   Total Bilirubin 0.7 0.2 - 1.2 mg/dL   Alkaline Phosphatase 77 39 - 117 U/L   AST 21 0 - 37 U/L   ALT 22 0 - 53 U/L   Total Protein 6.9 6.0 - 8.3 g/dL   Albumin 4.8 3.5 - 5.2 g/dL   GFR 11.45 >39.99 mL/min   Calcium 10.1 8.4 - 10.5 mg/dL  CBC  Result Value Ref Range   WBC 4.7 4.0 - 10.5 K/uL   RBC 4.52 4.22 - 5.81 Mil/uL   Platelets 188.0 150.0 - 400.0 K/uL   Hemoglobin 14.5 13.0 - 17.0 g/dL   HCT 56.0 60.9 - 47.9 %   MCV 97.0 78.0 - 100.0 fl   MCHC 33.0 30.0 - 36.0 g/dL   RDW 86.2 88.4 - 84.4 %  PSA  Result Value Ref Range   PSA 3.20 0.10 - 4.00 ng/mL  Urinalysis, Routine w reflex microscopic  Result Value Ref Range   Color, Urine YELLOW Yellow;Lt. Yellow;Straw;Dark Yellow;Amber;Green;Red;Brown   APPearance CLEAR Clear;Turbid;Slightly Cloudy;Cloudy   Specific Gravity, Urine <=1.005 (A) 1.000 - 1.030   pH 7.5 5.0 - 8.0   Total Protein, Urine NEGATIVE Negative   Urine Glucose NEGATIVE Negative   Ketones, ur NEGATIVE Negative   Bilirubin Urine NEGATIVE Negative   Hgb urine  dipstick NEGATIVE Negative  Urobilinogen, UA 0.2 0.0 - 1.0   Leukocytes,Ua NEGATIVE Negative   Nitrite NEGATIVE Negative   WBC, UA none seen 0-2/hpf   RBC / HPF none seen 0-2/hpf  Hemoglobin A1c  Result Value Ref Range   Hgb A1c MFr Bld 6.1 4.6 - 6.5 %  Lipoprotein A (LPA)  Result Value Ref Range   Lipoprotein (a) 131 (H) <75 nmol/L  Lipid panel  Result Value Ref Range   Cholesterol 218 (H) 0 - 200 mg/dL   Triglycerides 40.9 0.0 - 149.0 mg/dL   HDL 01.99 >60.99 mg/dL   VLDL 88.1 0.0 - 59.9 mg/dL   LDL Cholesterol 891 (H) 0 - 99 mg/dL   Total CHOL/HDL Ratio 2    NonHDL 120.07       The 10-year ASCVD risk score (Arnett DK, et al., 2019) is: 15.9%    Assessment & Plan:   Healthcare maintenance  Abdominal pain, unspecified abdominal location -     Comprehensive metabolic panel with GFR -     CBC -     Urinalysis, Routine w reflex microscopic -     Ambulatory referral to Gastroenterology -     Polyethylene Glycol 3350; Take 17 g by mouth 2 (two) times daily as needed. Dissolve 1 capful (17g) in 4-8 ounces of liquid and take by mouth daily.  Dispense: 3350 g; Refill: 1  Slow transit constipation -     Ambulatory referral to Gastroenterology -     Polyethylene Glycol 3350; Take 17 g by mouth 2 (two) times daily as needed. Dissolve 1 capful (17g) in 4-8 ounces of liquid and take by mouth daily.  Dispense: 3350 g; Refill: 1  Screening for diabetes mellitus -     Comprehensive metabolic panel with GFR -     Hemoglobin A1c  Screening for prostate cancer -     PSA  Elevated LDL cholesterol level -     Comprehensive metabolic panel with GFR -     Lipoprotein A (LPA) -     Lipid panel -     Atorvastatin Calcium; Take 1 tablet (20 mg total) by mouth daily.  Dispense: 90 tablet; Refill: 3  Immunization due -     Flu vaccine HIGH DOSE PF(Fluzone Trivalent) -     Pneumococcal conjugate vaccine 20-valent    Return in about 6 months (around 09/26/2024).    Elsie Sim Lent, MD

## 2024-03-30 ENCOUNTER — Ambulatory Visit: Payer: Self-pay | Admitting: Family Medicine

## 2024-04-03 LAB — LIPOPROTEIN A (LPA): Lipoprotein (a): 131 nmol/L — ABNORMAL HIGH (ref <75)

## 2024-04-05 ENCOUNTER — Encounter: Payer: Self-pay | Admitting: Family Medicine

## 2024-04-05 MED ORDER — ATORVASTATIN CALCIUM 20 MG PO TABS: 20.0000 mg | ORAL_TABLET | Freq: Every day | ORAL | 3 refills | Status: AC

## 2024-04-05 NOTE — Addendum Note (Signed)
 Addended by: BERNETA FALLOW A on: 04/05/2024 08:30 AM   Modules accepted: Orders

## 2024-04-08 DIAGNOSIS — C44622 Squamous cell carcinoma of skin of right upper limb, including shoulder: Secondary | ICD-10-CM | POA: Diagnosis not present

## 2024-04-13 ENCOUNTER — Encounter: Payer: Self-pay | Admitting: Family Medicine

## 2024-04-13 ENCOUNTER — Telehealth: Payer: Self-pay

## 2024-04-13 NOTE — Telephone Encounter (Signed)
 Copied from CRM [id number]. Topic: Clinical - Request for Lab/Test Order >> Apr 13, 2024 10:13 AM Revonda D wrote: Reason for CRM: Pt' sister Marval is calling to request that the fasting labs for the physical be completed before the physical. Marval stated that the pt would like to discuss the lab results on physical appt and that's why they are requesting the labs prior. Marval would like a callback to get the appt scheduled. CB [phone].  Dr. Berneta this patient would like to come in before his appt to complete his labs would this be possible?

## 2024-04-14 NOTE — Telephone Encounter (Signed)
 Informed Kyle Crawford that I passed the information along to our practice leader and she will reach out to the billing team. Cancelled appt scheduled for 04/23/24. Provided referral information for GI. No further concerns at this time.

## 2024-04-14 NOTE — Telephone Encounter (Signed)
 Spoke with patient's sister, Adrien, regarding concerns of 03/29/24 visit being billed as a physical. Pt scheduled the visit on 03/02/24 to discuss CT results and ongoing abdominal pain with PCP. Adrien states PCP palpated pt's abdomen, auscultated bowel sounds, performed a rectal exam, and auscultated his heart sounds. Adrien states there was nothing discussed or done to indicate a physical - not asked to fast, nothing. Adrien states had they scheduled a physical and knew it was a physical, there are specific questions they would have asked at that time. Now that insurance has been billed for a physical, another one will not be covered until next year. Looking at the Appt Desk, the 03/29/24 visit was scheduled as an Office Visit. Chief Complaint in OV notes also states abdominal pain.

## 2024-04-14 NOTE — Telephone Encounter (Signed)
 Msg sent to billing manager for review.

## 2024-04-16 NOTE — Telephone Encounter (Signed)
 After review it was determined the CPE charge would be voided from 03/29/2024 visit. I contacted Marval to notify and reschedule CPE for the patient. She was appreciative and will schedule a CPE, but is wanting pt to see GI first. I have sent a message to [facility] referral coordinator requesting her to contact Debbie for scheduling. Marval stated she called but was on hold a very long time and had to hang up.

## 2024-04-22 NOTE — Telephone Encounter (Signed)
 [facility] contacted patient's sister and notified her Dr. Wilhelmenia does not have any availability through December and they do not have his calendar available for January.  They offered next available with his PA which was 04/30/24 and she declined.  Sister stated that Mr. Vences would want to his a MD. See referral for additional info.

## 2024-04-23 ENCOUNTER — Encounter: Admitting: Family Medicine

## 2024-07-22 ENCOUNTER — Encounter: Payer: Self-pay | Admitting: Internal Medicine

## 2024-07-22 ENCOUNTER — Other Ambulatory Visit: Payer: Self-pay | Admitting: Internal Medicine

## 2024-07-22 DIAGNOSIS — E785 Hyperlipidemia, unspecified: Secondary | ICD-10-CM

## 2024-08-03 ENCOUNTER — Ambulatory Visit
Admission: RE | Admit: 2024-08-03 | Discharge: 2024-08-03 | Disposition: A | Source: Ambulatory Visit | Attending: Internal Medicine

## 2024-08-03 DIAGNOSIS — E785 Hyperlipidemia, unspecified: Secondary | ICD-10-CM

## 2024-09-27 ENCOUNTER — Ambulatory Visit: Admitting: Family Medicine

## 2025-01-14 ENCOUNTER — Ambulatory Visit
